# Patient Record
Sex: Female | Born: 1935 | Race: White | Hispanic: No | Marital: Married | Smoking: Never smoker
Health system: Southern US, Community
[De-identification: ages and names within clinical notes are randomized; demographics above are authoritative.]

## PROBLEM LIST (undated history)

## (undated) DIAGNOSIS — I639 Cerebral infarction, unspecified: Secondary | ICD-10-CM

## (undated) DIAGNOSIS — M1990 Unspecified osteoarthritis, unspecified site: Secondary | ICD-10-CM

## (undated) DIAGNOSIS — F32A Depression, unspecified: Secondary | ICD-10-CM

## (undated) DIAGNOSIS — I739 Peripheral vascular disease, unspecified: Secondary | ICD-10-CM

## (undated) DIAGNOSIS — I779 Disorder of arteries and arterioles, unspecified: Secondary | ICD-10-CM

## (undated) DIAGNOSIS — F329 Major depressive disorder, single episode, unspecified: Secondary | ICD-10-CM

## (undated) DIAGNOSIS — I10 Essential (primary) hypertension: Secondary | ICD-10-CM

## (undated) DIAGNOSIS — E785 Hyperlipidemia, unspecified: Secondary | ICD-10-CM

## (undated) HISTORY — DX: Depression, unspecified: F32.A

## (undated) HISTORY — DX: Peripheral vascular disease, unspecified: I73.9

## (undated) HISTORY — DX: Cerebral infarction, unspecified: I63.9

## (undated) HISTORY — DX: Unspecified osteoarthritis, unspecified site: M19.90

## (undated) HISTORY — DX: Disorder of arteries and arterioles, unspecified: I77.9

## (undated) HISTORY — PX: ABDOMINAL HYSTERECTOMY: SHX81

## (undated) HISTORY — DX: Hyperlipidemia, unspecified: E78.5

## (undated) HISTORY — DX: Major depressive disorder, single episode, unspecified: F32.9

## (undated) HISTORY — DX: Essential (primary) hypertension: I10

## (undated) HISTORY — PX: APPENDECTOMY: SHX54

---

## 1968-09-01 HISTORY — PX: PARTIAL HYSTERECTOMY: SHX80

## 1997-01-01 HISTORY — PX: ESOPHAGOGASTRODUODENOSCOPY: SHX1529

## 1998-01-01 LAB — HM DEXA SCAN: HM Dexa Scan: NEGATIVE

## 2000-01-02 HISTORY — PX: HEMORRHOID SURGERY: SHX153

## 2001-01-01 HISTORY — PX: CHOLECYSTECTOMY: SHX55

## 2005-08-07 ENCOUNTER — Ambulatory Visit: Payer: Self-pay | Admitting: Family Medicine

## 2005-08-29 ENCOUNTER — Ambulatory Visit: Payer: Self-pay | Admitting: Family Medicine

## 2005-09-06 ENCOUNTER — Ambulatory Visit: Payer: Self-pay | Admitting: Internal Medicine

## 2005-09-18 ENCOUNTER — Ambulatory Visit: Payer: Self-pay | Admitting: Family Medicine

## 2005-12-01 DIAGNOSIS — I10 Essential (primary) hypertension: Secondary | ICD-10-CM

## 2005-12-13 ENCOUNTER — Ambulatory Visit: Payer: Self-pay | Admitting: Family Medicine

## 2005-12-17 ENCOUNTER — Ambulatory Visit: Payer: Self-pay | Admitting: Family Medicine

## 2006-02-07 ENCOUNTER — Ambulatory Visit: Payer: Self-pay | Admitting: Family Medicine

## 2006-02-15 ENCOUNTER — Ambulatory Visit: Payer: Self-pay | Admitting: Licensed Clinical Social Worker

## 2006-02-25 ENCOUNTER — Ambulatory Visit: Payer: Self-pay | Admitting: Family Medicine

## 2006-04-01 ENCOUNTER — Encounter: Payer: Self-pay | Admitting: Family Medicine

## 2006-04-01 DIAGNOSIS — M1990 Unspecified osteoarthritis, unspecified site: Secondary | ICD-10-CM | POA: Insufficient documentation

## 2006-04-01 DIAGNOSIS — E7800 Pure hypercholesterolemia, unspecified: Secondary | ICD-10-CM

## 2006-04-01 DIAGNOSIS — F329 Major depressive disorder, single episode, unspecified: Secondary | ICD-10-CM

## 2006-04-01 DIAGNOSIS — J309 Allergic rhinitis, unspecified: Secondary | ICD-10-CM | POA: Insufficient documentation

## 2006-04-29 ENCOUNTER — Telehealth (INDEPENDENT_AMBULATORY_CARE_PROVIDER_SITE_OTHER): Payer: Self-pay | Admitting: *Deleted

## 2006-04-30 ENCOUNTER — Ambulatory Visit: Payer: Self-pay | Admitting: Family Medicine

## 2006-08-23 ENCOUNTER — Encounter: Payer: Self-pay | Admitting: Family Medicine

## 2006-10-19 ENCOUNTER — Encounter: Payer: Self-pay | Admitting: Family Medicine

## 2007-09-26 ENCOUNTER — Encounter (INDEPENDENT_AMBULATORY_CARE_PROVIDER_SITE_OTHER): Payer: Self-pay | Admitting: *Deleted

## 2008-07-09 ENCOUNTER — Ambulatory Visit: Payer: Self-pay | Admitting: Family Medicine

## 2008-07-09 LAB — CONVERTED CEMR LAB

## 2008-08-05 ENCOUNTER — Ambulatory Visit: Payer: Self-pay | Admitting: Family Medicine

## 2008-08-06 LAB — CONVERTED CEMR LAB
ALT: 24 units/L (ref 0–35)
AST: 28 units/L (ref 0–37)
Albumin: 3.9 g/dL (ref 3.5–5.2)
Alkaline Phosphatase: 67 units/L (ref 39–117)
BUN: 17 mg/dL (ref 6–23)
Basophils Absolute: 0.1 10*3/uL (ref 0.0–0.1)
Basophils Relative: 1 % (ref 0.0–3.0)
Bilirubin, Direct: 0.1 mg/dL (ref 0.0–0.3)
CO2: 29 meq/L (ref 19–32)
Calcium: 9.6 mg/dL (ref 8.4–10.5)
Chloride: 108 meq/L (ref 96–112)
Cholesterol: 326 mg/dL — ABNORMAL HIGH (ref 0–200)
Creatinine, Ser: 0.8 mg/dL (ref 0.4–1.2)
Direct LDL: 240.9 mg/dL
Eosinophils Absolute: 0.5 10*3/uL (ref 0.0–0.7)
Eosinophils Relative: 6.5 % — ABNORMAL HIGH (ref 0.0–5.0)
GFR calc non Af Amer: 73.78 mL/min (ref >60)
Glucose, Bld: 115 mg/dL — ABNORMAL HIGH (ref 70–99)
HCT: 44.2 % (ref 36.0–46.0)
HDL: 70.6 mg/dL (ref >39.00)
Hemoglobin: 15 g/dL (ref 12.0–15.0)
Lymphocytes Relative: 31.4 % (ref 12.0–46.0)
Lymphs Abs: 2.3 10*3/uL (ref 0.7–4.0)
MCHC: 33.9 g/dL (ref 30.0–36.0)
MCV: 90.1 fL (ref 78.0–100.0)
Monocytes Absolute: 0.5 10*3/uL (ref 0.1–1.0)
Monocytes Relative: 7.4 % (ref 3.0–12.0)
Neutro Abs: 4 10*3/uL (ref 1.4–7.7)
Neutrophils Relative %: 53.7 % (ref 43.0–77.0)
Platelets: 203 10*3/uL (ref 150.0–400.0)
Potassium: 4.7 meq/L (ref 3.5–5.1)
RBC: 4.91 M/uL (ref 3.87–5.11)
RDW: 12.3 % (ref 11.5–14.6)
Sodium: 146 meq/L — ABNORMAL HIGH (ref 135–145)
TSH: 2.94 microintl units/mL (ref 0.35–5.50)
Total Bilirubin: 0.9 mg/dL (ref 0.3–1.2)
Total CHOL/HDL Ratio: 5
Total Protein: 7 g/dL (ref 6.0–8.3)
Triglycerides: 122 mg/dL (ref 0.0–149.0)
VLDL: 24.4 mg/dL (ref 0.0–40.0)
WBC: 7.4 10*3/uL (ref 4.5–10.5)

## 2008-08-23 ENCOUNTER — Ambulatory Visit: Payer: Self-pay | Admitting: Family Medicine

## 2008-08-23 DIAGNOSIS — E119 Type 2 diabetes mellitus without complications: Secondary | ICD-10-CM

## 2008-08-23 LAB — CONVERTED CEMR LAB
Cholesterol, target level: 200 mg/dL
HDL goal, serum: 40 mg/dL
LDL Goal: 160 mg/dL

## 2008-08-31 ENCOUNTER — Ambulatory Visit: Payer: Self-pay | Admitting: Family Medicine

## 2008-09-02 LAB — CONVERTED CEMR LAB
BUN: 18 mg/dL (ref 6–23)
CO2: 32 meq/L (ref 19–32)
Calcium: 9.9 mg/dL (ref 8.4–10.5)
Chloride: 106 meq/L (ref 96–112)
Creatinine, Ser: 0.8 mg/dL (ref 0.4–1.2)
GFR calc non Af Amer: 73.77 mL/min (ref >60)
Glucose, Bld: 118 mg/dL — ABNORMAL HIGH (ref 70–99)
Potassium: 5.1 meq/L (ref 3.5–5.1)
Sodium: 144 meq/L (ref 135–145)

## 2008-09-14 ENCOUNTER — Encounter: Payer: Self-pay | Admitting: Family Medicine

## 2009-05-26 ENCOUNTER — Ambulatory Visit: Payer: Self-pay | Admitting: Family Medicine

## 2009-08-04 ENCOUNTER — Telehealth: Payer: Self-pay | Admitting: Family Medicine

## 2009-08-12 ENCOUNTER — Ambulatory Visit: Payer: Self-pay | Admitting: Family Medicine

## 2009-09-16 ENCOUNTER — Ambulatory Visit: Payer: Self-pay | Admitting: Family Medicine

## 2009-09-16 ENCOUNTER — Encounter: Payer: Self-pay | Admitting: Family Medicine

## 2009-09-19 ENCOUNTER — Ambulatory Visit: Payer: Self-pay | Admitting: Family Medicine

## 2009-09-22 LAB — CONVERTED CEMR LAB
ALT: 19 units/L (ref 0–35)
AST: 22 units/L (ref 0–37)
Albumin: 4 g/dL (ref 3.5–5.2)
Alkaline Phosphatase: 67 units/L (ref 39–117)
BUN: 19 mg/dL (ref 6–23)
Basophils Absolute: 0.1 10*3/uL (ref 0.0–0.1)
Basophils Relative: 0.9 % (ref 0.0–3.0)
Bilirubin, Direct: 0.1 mg/dL (ref 0.0–0.3)
CO2: 28 meq/L (ref 19–32)
Calcium: 9.3 mg/dL (ref 8.4–10.5)
Chloride: 101 meq/L (ref 96–112)
Cholesterol: 388 mg/dL — ABNORMAL HIGH (ref 0–200)
Creatinine, Ser: 0.8 mg/dL (ref 0.4–1.2)
Direct LDL: 280.9 mg/dL
Eosinophils Absolute: 0.5 10*3/uL (ref 0.0–0.7)
Eosinophils Relative: 6.8 % — ABNORMAL HIGH (ref 0.0–5.0)
GFR calc non Af Amer: 76.89 mL/min (ref >60)
Glucose, Bld: 135 mg/dL — ABNORMAL HIGH (ref 70–99)
HCT: 44.6 % (ref 36.0–46.0)
HDL: 72.8 mg/dL (ref >39.00)
Hemoglobin: 15.3 g/dL — ABNORMAL HIGH (ref 12.0–15.0)
Lymphocytes Relative: 38.2 % (ref 12.0–46.0)
Lymphs Abs: 2.6 10*3/uL (ref 0.7–4.0)
MCHC: 34.3 g/dL (ref 30.0–36.0)
MCV: 89.9 fL (ref 78.0–100.0)
Monocytes Absolute: 0.4 10*3/uL (ref 0.1–1.0)
Monocytes Relative: 5.7 % (ref 3.0–12.0)
Neutro Abs: 3.4 10*3/uL (ref 1.4–7.7)
Neutrophils Relative %: 48.4 % (ref 43.0–77.0)
Platelets: 227 10*3/uL (ref 150.0–400.0)
Potassium: 4.7 meq/L (ref 3.5–5.1)
RBC: 4.96 M/uL (ref 3.87–5.11)
RDW: 13.7 % (ref 11.5–14.6)
Sodium: 138 meq/L (ref 135–145)
TSH: 2.89 microintl units/mL (ref 0.35–5.50)
Total Bilirubin: 0.8 mg/dL (ref 0.3–1.2)
Total CHOL/HDL Ratio: 5
Total Protein: 6.7 g/dL (ref 6.0–8.3)
Triglycerides: 325 mg/dL — ABNORMAL HIGH (ref 0.0–149.0)
VLDL: 65 mg/dL — ABNORMAL HIGH (ref 0.0–40.0)
Vit D, 25-Hydroxy: 50 ng/mL (ref 30–89)
Vitamin B-12: 855 pg/mL (ref 211–911)
WBC: 6.9 10*3/uL (ref 4.5–10.5)

## 2009-12-27 ENCOUNTER — Ambulatory Visit: Payer: Self-pay | Admitting: Family Medicine

## 2009-12-27 DIAGNOSIS — J029 Acute pharyngitis, unspecified: Secondary | ICD-10-CM | POA: Insufficient documentation

## 2010-01-20 ENCOUNTER — Ambulatory Visit
Admission: RE | Admit: 2010-01-20 | Discharge: 2010-01-20 | Payer: Self-pay | Source: Home / Self Care | Attending: Family Medicine | Admitting: Family Medicine

## 2010-01-20 ENCOUNTER — Other Ambulatory Visit: Payer: Self-pay | Admitting: Family Medicine

## 2010-01-20 LAB — FECAL OCCULT BLOOD, GUAIAC: Fecal Occult Blood: NEGATIVE

## 2010-01-20 LAB — FECAL OCCULT BLOOD, IMMUNOCHEMICAL: Fecal Occult Bld: NEGATIVE

## 2010-01-23 ENCOUNTER — Encounter (INDEPENDENT_AMBULATORY_CARE_PROVIDER_SITE_OTHER): Payer: Self-pay | Admitting: *Deleted

## 2010-01-31 NOTE — Progress Notes (Signed)
Summary: Elevated BP at dentist office  Phone Note From Other Clinic Call back at [phone]   Caller: Ericka/Dentist office Call For: Dr. Ermalene Searing Summary of Call: Nurse from dentist office called to let us know that patient's blood pressure is very high.  Patient was in there office to have a tooth extracted today but they will not be doing that procedure because of her blood pressure.  They had her on the blood pressure monitor since 11:18 am this morning and her blood pressure were as follows: 214/114, 215/115, 228/119.  The nurse took her blood pressure manually and it was 240/110 right arm, and 230/107 left arm.  Patient is still there in the office and they are waiting to hear from [place].  Please advise.  Patient took herself off of her BP medications about a month ago.  Having not headaches or other symptoms.   Initial call taken by: Linde Gillis CMA Duncan Dull),  August 04, 2009 11:38 AM  Follow-up for Phone Call        Stopped lisinopril 2 months ago. Restart today. Follow up appt in 1 week for BP check.  Follow-up by: Kerby Nora MD,  August 04, 2009 11:42 AM
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
## 2010-02-02 NOTE — Letter (Signed)
Summary: Results Follow up Letter  [place] [facility]  [address] [place], [place] [address]   Phone: [phone]  Fax: [phone]    01/23/2010 MRN: [id number]     Carla Campos [address] [place], [place]  [address]    Dear Ms. Rapley,  The following are the results of your recent test(s):  Test         Result    Pap Smear:        Normal _____  Not Normal _____ Comments: ______________________________________________________ Cholesterol: LDL(Bad cholesterol):         Your goal is less than:         HDL (Good cholesterol):       Your goal is more than: Comments:  ______________________________________________________ Mammogram:        Normal _____  Not Normal _____ Comments:  ___________________________________________________________________ Hemoccult:        Normal __x___  Not normal _______ Comments:  Repeat in 1 year  _____________________________________________________________________ Other Tests:    We routinely do not discuss normal results over the telephone.  If you desire a copy of the results, or you have any questions about this information we can discuss them at your next office visit.   Sincerely,  Kerby Nora MD

## 2010-02-02 NOTE — Letter (Signed)
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

## 2010-02-27 ENCOUNTER — Encounter: Payer: Self-pay | Admitting: Family Medicine

## 2010-03-07 ENCOUNTER — Encounter: Payer: Self-pay | Admitting: Family Medicine

## 2010-03-31 ENCOUNTER — Other Ambulatory Visit: Payer: Self-pay | Admitting: Family Medicine

## 2010-03-31 DIAGNOSIS — E7800 Pure hypercholesterolemia, unspecified: Secondary | ICD-10-CM

## 2010-04-03 ENCOUNTER — Other Ambulatory Visit (INDEPENDENT_AMBULATORY_CARE_PROVIDER_SITE_OTHER): Payer: No Typology Code available for payment source | Admitting: Family Medicine

## 2010-04-03 DIAGNOSIS — E7800 Pure hypercholesterolemia, unspecified: Secondary | ICD-10-CM

## 2010-04-03 DIAGNOSIS — E119 Type 2 diabetes mellitus without complications: Secondary | ICD-10-CM

## 2010-04-03 LAB — LIPID PANEL
Cholesterol: 299 mg/dL — ABNORMAL HIGH (ref 0–200)
HDL: 75.6 mg/dL (ref >39.00)
Triglycerides: 253 mg/dL — ABNORMAL HIGH (ref 0.0–149.0)
VLDL: 50.6 mg/dL — ABNORMAL HIGH (ref 0.0–40.0)

## 2010-04-03 LAB — LDL CHOLESTEROL, DIRECT: Direct LDL: 186.1 mg/dL

## 2010-04-03 LAB — MICROALBUMIN / CREATININE URINE RATIO
Creatinine,U: 138.7 mg/dL
Microalb Creat Ratio: 3.6 mg/g (ref 0.0–30.0)

## 2010-04-10 ENCOUNTER — Ambulatory Visit: Payer: Self-pay | Admitting: Family Medicine

## 2010-04-28 ENCOUNTER — Ambulatory Visit (INDEPENDENT_AMBULATORY_CARE_PROVIDER_SITE_OTHER): Payer: No Typology Code available for payment source | Admitting: Family Medicine

## 2010-04-28 ENCOUNTER — Encounter: Payer: Self-pay | Admitting: Family Medicine

## 2010-04-28 DIAGNOSIS — E119 Type 2 diabetes mellitus without complications: Secondary | ICD-10-CM

## 2010-04-28 DIAGNOSIS — R0989 Other specified symptoms and signs involving the circulatory and respiratory systems: Secondary | ICD-10-CM | POA: Insufficient documentation

## 2010-04-28 DIAGNOSIS — E7800 Pure hypercholesterolemia, unspecified: Secondary | ICD-10-CM

## 2010-04-28 DIAGNOSIS — I10 Essential (primary) hypertension: Secondary | ICD-10-CM

## 2010-04-28 MED ORDER — LISINOPRIL 5 MG PO TABS: 5.0000 mg | ORAL_TABLET | Freq: Every day | ORAL | Status: DC

## 2010-04-28 NOTE — Progress Notes (Signed)
  Subjective:    Patient ID: Carla Campos, female    DOB: [birth year], 77 y.o.   MRN: [id number]  HPI  Diabetes: Well controlled on no medicaiton. Hypoglycemic episodes:None Hyperglycemic episodes:None Feet problems:None Blood Sugars averaging:NOT CHECKING eye exam within last year:  Hypertension:  Fairly well controlled for age. Using medication without problems or lightheadedness. Occ fluctuations 100/53 on lisinopril 10 mg daily.  Some fatigue She has been taking lisinopril every other night and BPs have been running better. Occ 170 sys. Chest pain with exertion: None Edema:None Short of breath:None Other issues:None  Elevated Cholesterol: Continued poor control but 100 points lower than last check. Has been taking red yeast rice. No SE.  Refused chol medication in past. Using fish oil now up to 1500 mg daily.        Review of Systems  Constitutional: Positive for fatigue. Negative for fever.       [Not severe fatigue HENT: Negative for ear pain.   Eyes: Negative for pain.  Respiratory: Negative for chest tightness and shortness of breath.   Cardiovascular: Negative for chest pain, palpitations and leg swelling.  Gastrointestinal: Negative for abdominal pain.  Genitourinary: Negative for dysuria.       Objective:   Physical Exam  Constitutional: Vital signs are normal. She appears well-developed and well-nourished. She is cooperative.  Non-toxic appearance. She does not appear ill. No distress.  HENT:  Head: Normocephalic.  Right Ear: Hearing, tympanic membrane, external ear and ear canal normal. Tympanic membrane is not erythematous, not retracted and not bulging.  Left Ear: Hearing, tympanic membrane, external ear and ear canal normal. Tympanic membrane is not erythematous, not retracted and not bulging.  Nose: No mucosal edema or rhinorrhea. Right sinus exhibits no maxillary sinus tenderness and no frontal sinus tenderness. Left sinus exhibits no maxillary  sinus tenderness and no frontal sinus tenderness.  Mouth/Throat: Uvula is midline, oropharynx is clear and moist and mucous membranes are normal.  Eyes: Conjunctivae, EOM and lids are normal. Pupils are equal, round, and reactive to light. No foreign bodies found.  Neck: Trachea normal and normal range of motion. Neck supple. Carotid bruit is present. No mass and no thyromegaly present.  Cardiovascular: Normal rate, regular rhythm, S1 normal, S2 normal, normal heart sounds, intact distal pulses and normal pulses.  Exam reveals no gallop and no friction rub.   No murmur heard. Pulmonary/Chest: Effort normal and breath sounds normal. Not tachypneic. No respiratory distress. She has no decreased breath sounds. She has no wheezes. She has no rhonchi. She has no rales.  Abdominal: Soft. Normal appearance and bowel sounds are normal. There is no tenderness.  Neurological: She is alert.  Skin: Skin is warm, dry and intact. No rash noted.  Psychiatric: Her speech is normal and behavior is normal. Judgment and thought content normal. Her mood appears not anxious. Cognition and memory are normal. She does not exhibit a depressed mood.       Diabetic foot exam: Normal inspection No skin breakdown No calluses  Normal DP pulses Normal sensation to light tough and monofilament Nails normal    Assessment & Plan:

## 2010-04-28 NOTE — Patient Instructions (Addendum)
BP today was 130/72. Increase red yeast rice up to 2400 mg daily. Stop by front desk to schedule carotid dopplers.

## 2010-04-28 NOTE — Assessment & Plan Note (Signed)
Well controlled on no medication  

## 2010-04-28 NOTE — Assessment & Plan Note (Signed)
Change prescription to 5 mg daily given low BPs... She will take daily instead of every other day. Follow BPs at home. Call if regularly >130/80.

## 2010-04-28 NOTE — Assessment & Plan Note (Signed)
New finding, but per pt she has had this noted years ago.. Was followed but then told she did not have to any longer. Send for carotid dopplers to determine status.

## 2010-04-28 NOTE — Assessment & Plan Note (Signed)
Improvfed control, but far from goal. Increase red yeast rice to 2400 mg daily.

## 2010-05-10 ENCOUNTER — Other Ambulatory Visit: Payer: Self-pay | Admitting: Family Medicine

## 2010-05-10 ENCOUNTER — Other Ambulatory Visit: Payer: Self-pay | Admitting: *Deleted

## 2010-05-10 ENCOUNTER — Ambulatory Visit (INDEPENDENT_AMBULATORY_CARE_PROVIDER_SITE_OTHER): Payer: No Typology Code available for payment source | Admitting: *Deleted

## 2010-05-10 DIAGNOSIS — R0989 Other specified symptoms and signs involving the circulatory and respiratory systems: Secondary | ICD-10-CM

## 2010-05-10 DIAGNOSIS — I6529 Occlusion and stenosis of unspecified carotid artery: Secondary | ICD-10-CM

## 2010-05-11 ENCOUNTER — Telehealth: Payer: Self-pay | Admitting: Family Medicine

## 2010-05-11 ENCOUNTER — Encounter: Payer: Self-pay | Admitting: Family Medicine

## 2010-05-11 DIAGNOSIS — I6523 Occlusion and stenosis of bilateral carotid arteries: Secondary | ICD-10-CM

## 2010-05-11 NOTE — Telephone Encounter (Signed)
Notify pt she has significant blockage in her carotid vessels. Close watch is indicated. Will repeat in 6 months.

## 2010-05-17 NOTE — Telephone Encounter (Signed)
Patient advised.
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
## 2010-06-09 NOTE — Telephone Encounter (Signed)
Patient is on recall list at [facility] for carotid doppler in November 2012. We put her on our defer list and will call her in November when she is due for followup. MK

## 2010-06-29 LAB — HM DIABETES EYE EXAM

## 2010-09-28 ENCOUNTER — Ambulatory Visit (INDEPENDENT_AMBULATORY_CARE_PROVIDER_SITE_OTHER): Payer: No Typology Code available for payment source | Admitting: Family Medicine

## 2010-09-28 ENCOUNTER — Encounter: Payer: Self-pay | Admitting: Family Medicine

## 2010-09-28 DIAGNOSIS — Z23 Encounter for immunization: Secondary | ICD-10-CM

## 2010-09-28 DIAGNOSIS — R319 Hematuria, unspecified: Secondary | ICD-10-CM | POA: Insufficient documentation

## 2010-09-28 DIAGNOSIS — N898 Other specified noninflammatory disorders of vagina: Secondary | ICD-10-CM

## 2010-09-28 DIAGNOSIS — K219 Gastro-esophageal reflux disease without esophagitis: Secondary | ICD-10-CM | POA: Insufficient documentation

## 2010-09-28 DIAGNOSIS — N939 Abnormal uterine and vaginal bleeding, unspecified: Secondary | ICD-10-CM

## 2010-09-28 LAB — POCT URINALYSIS DIPSTICK
Glucose, UA: NEGATIVE
Nitrite, UA: NEGATIVE
Protein, UA: NEGATIVE
Spec Grav, UA: 1.005
Urobilinogen, UA: NEGATIVE

## 2010-09-28 NOTE — Patient Instructions (Addendum)
Stop famotidine.  Start prilosec 2 x 20 mg tabs daily for 4-6 weeks, then if reflux resolved wean off. Avoid reflux triggering food. We will call you with urine culture results.

## 2010-09-28 NOTE — Assessment & Plan Note (Signed)
Poor control.. Start PPI prilosec 40 mg daily 4-6 weeks. Change diet and lifestyle. Wean off if improved, but call if not improved.

## 2010-09-28 NOTE — Progress Notes (Signed)
  Subjective:    Patient ID: Carla Campos, female    DOB: 22-May-1928, 77 y.o.   MRN: [id number]  HPI  77 year old postmenopausal female with new onset 1 month ago.. ? Blood on toilet tissue after BM. None since.  4 days ago noted bright red blood on toilet tissue after urination.  Since then notes blood on pad in her panties.  No dysuria, no pain with BM. Has noted some urinary frequency no urgency. No abdominal pain,no fever. Does note mild labial burning occ  mild low back ache.  Not on ASA.  Does note indigestion almost every time she eats... occ taking famotidine. Has not been taking prilosec daily.   Review of Systems  Constitutional: Negative for fever and fatigue.  HENT: Negative for ear pain.   Eyes: Negative for pain.  Respiratory: Negative for chest tightness and shortness of breath.   Cardiovascular: Positive for chest pain. Negative for palpitations and leg swelling.  Gastrointestinal: Negative for abdominal pain.  Genitourinary: Negative for dysuria.       Objective:   Physical Exam  Constitutional: Vital signs are normal. She appears well-developed and well-nourished. She is cooperative.  Non-toxic appearance. She does not appear ill. No distress.  HENT:  Head: Normocephalic.  Right Ear: Hearing, tympanic membrane, external ear and ear canal normal. Tympanic membrane is not erythematous, not retracted and not bulging.  Left Ear: Hearing, tympanic membrane, external ear and ear canal normal. Tympanic membrane is not erythematous, not retracted and not bulging.  Nose: No mucosal edema or rhinorrhea. Right sinus exhibits no maxillary sinus tenderness and no frontal sinus tenderness. Left sinus exhibits no maxillary sinus tenderness and no frontal sinus tenderness.  Mouth/Throat: Uvula is midline, oropharynx is clear and moist and mucous membranes are normal.  Eyes: Conjunctivae, EOM and lids are normal. Pupils are equal, round, and reactive to light. No foreign  bodies found.  Neck: Trachea normal and normal range of motion. Neck supple. Carotid bruit is not present. No mass and no thyromegaly present.  Cardiovascular: Normal rate, regular rhythm, S1 normal, S2 normal, normal heart sounds, intact distal pulses and normal pulses.  Exam reveals no gallop and no friction rub.   No murmur heard. Pulmonary/Chest: Effort normal and breath sounds normal. Not tachypneic. No respiratory distress. She has no decreased breath sounds. She has no wheezes. She has no rhonchi. She has no rales.  Abdominal: Soft. Normal appearance and bowel sounds are normal. There is no tenderness.  Genitourinary: Rectum normal. Rectal exam shows no external hemorrhoid, no internal hemorrhoid, no fissure, no mass, no tenderness and anal tone normal. Guaiac negative stool. There is no rash, tenderness, lesion or injury on the right labia. There is no tenderness, lesion or injury on the left labia. No erythema or tenderness around the vagina. No foreign body around the vagina. No signs of injury around the vagina. No vaginal discharge found.       Dry vaginal tissues, postmenopausal atrophy mild, o lesions No redness of irritation around urethral opening.  Neurological: She is alert.  Skin: Skin is warm, dry and intact. No rash noted.  Psychiatric: Her speech is normal and behavior is normal. Judgment and thought content normal. Her mood appears not anxious. Cognition and memory are normal. She does not exhibit a depressed mood.          Assessment & Plan:

## 2010-09-28 NOTE — Assessment & Plan Note (Signed)
No blood seen on rectal exam, neg hemeoccult.  No blood in vaginal vault, s/sp partial hysterectomy, no vaginal lesions.  Large blood on UA, occ rbc on micro.. send for culture, no pain associated with stones.  if culture negative .Marland Kitchen Consider urology eval for bladder cancer concern etc.

## 2010-09-30 LAB — URINE CULTURE: Colony Count: NO GROWTH

## 2010-10-02 ENCOUNTER — Telehealth: Payer: Self-pay | Admitting: Family Medicine

## 2010-10-02 NOTE — Telephone Encounter (Signed)
Message copied by Excell Seltzer on Mon Oct 02, 2010  4:45 PM ------      Message from: Consuello Masse      Created: Mon Oct 02, 2010  2:24 PM       Patient is still having bleeding

## 2010-10-02 NOTE — Telephone Encounter (Signed)
Given most clear source in blood in urine... Will refer to urology for further evaluation.

## 2010-10-02 NOTE — Telephone Encounter (Signed)
Patient advised.

## 2010-10-03 NOTE — Telephone Encounter (Signed)
Urology consult made with Dr Reuel Boom on 10/10/2010 patient notified. MK

## 2010-10-11 ENCOUNTER — Ambulatory Visit: Payer: No Typology Code available for payment source | Admitting: Specialist

## 2010-10-24 ENCOUNTER — Other Ambulatory Visit: Payer: Medicare Other

## 2010-10-31 ENCOUNTER — Ambulatory Visit: Payer: Medicare Other | Admitting: Family Medicine

## 2010-11-02 ENCOUNTER — Ambulatory Visit: Payer: Medicare Other | Admitting: Family Medicine

## 2010-12-04 ENCOUNTER — Ambulatory Visit (INDEPENDENT_AMBULATORY_CARE_PROVIDER_SITE_OTHER): Payer: Medicare Other | Admitting: Family Medicine

## 2010-12-04 ENCOUNTER — Encounter: Payer: Self-pay | Admitting: Family Medicine

## 2010-12-04 DIAGNOSIS — J040 Acute laryngitis: Secondary | ICD-10-CM

## 2010-12-04 MED ORDER — HYDROCODONE-HOMATROPINE 5-1.5 MG/5ML PO SYRP: ORAL_SOLUTION | ORAL | Status: AC

## 2010-12-04 NOTE — Progress Notes (Signed)
  Patient Name: Carla Campos Date of Birth: 06-20-1928 Age: 77 y.o. Medical Record Number: [id number] Gender: female  History of Present Illness:  Carla Campos is a 77 y.o. very pleasant female patient who presents with the following:  1 week of coughing, feeling terrible. Feels tired and weak all the time. Taking littl enaps. Coughing al night is really bad  Getting up in the middle of the night. Taking some cough syrup.  + laryngitis over the last week Mild st.  No n/v/d Eating ok   Past Medical History, Surgical History, Social History, Family History, and Problem List have been reviewed in EHR and updated if relevant.  Review of Systems: ROS: GEN: Acute illness details above GI: Tolerating PO intake GU: maintaining adequate hydration and urination Pulm: No SOB Interactive and getting along well at home.  Otherwise, ROS is as per the HPI.   Physical Examination: Filed Vitals:   12/04/10 1524  BP: 120/70  Pulse: 109  Temp: 97.7 F (36.5 C)  TempSrc: Oral  Height: 5\' 2"  (1.575 m)  Weight: 116 lb (52.617 kg)  SpO2: 99%    Body mass index is 21.22 kg/(m^2).   Gen: WDWN, NAD; A & O x3, cooperative. Pleasant.Globally Non-toxic HEENT: Normocephalic and atraumatic. Throat clear, w/o exudate, R TM clear, L TM - good landmarks, No fluid present. rhinnorhea.  MMM Frontal sinuses: NT Max sinuses: NT NECK: Anterior cervical  LAD is absent CV: RRR, No M/G/R, cap refill <2 sec PULM: Breathing comfortably in no respiratory distress. no wheezing, crackles, rhonchi EXT: No c/c/e PSYCH: Friendly, good eye contact MSK: Nml gait    Assessment and Plan:  URI/ laryngitis I discussed upper respiratory tract infections with the patient and explained viral infections in general.  Recommended plenty of sleep. Symptomatic care with pushing fluids. Oral acetaminophen or NSAIDs as tolerated for body aches, chills, fevers.  follow-up if acutely worsens

## 2010-12-06 ENCOUNTER — Telehealth: Payer: Self-pay | Admitting: Internal Medicine

## 2010-12-06 NOTE — Telephone Encounter (Signed)
Patient called and stated that she was seen by you on 12/05/10 and she can't afford the hydrocodone syrup because it was $55 dollars and wanted to know if there was something less expensive with Hydrocodone in it.  Please advise.

## 2010-12-06 NOTE — Telephone Encounter (Signed)
rx called in

## 2010-12-06 NOTE — Telephone Encounter (Signed)
Ok to change to generic robitussin with codeine, 1 tsp po qhs prn cough, disp 240 mL

## 2010-12-11 ENCOUNTER — Ambulatory Visit (INDEPENDENT_AMBULATORY_CARE_PROVIDER_SITE_OTHER): Payer: Medicare Other | Admitting: Family Medicine

## 2010-12-11 ENCOUNTER — Encounter: Payer: Self-pay | Admitting: Family Medicine

## 2010-12-11 VITALS — BP 148/84 | HR 84 | Temp 98.7°F | Wt 118.2 lb

## 2010-12-11 DIAGNOSIS — J019 Acute sinusitis, unspecified: Secondary | ICD-10-CM | POA: Insufficient documentation

## 2010-12-11 MED ORDER — DOXYCYCLINE HYCLATE 100 MG PO CAPS: 100.0000 mg | ORAL_CAPSULE | Freq: Two times a day (BID) | ORAL | Status: AC

## 2010-12-11 MED ORDER — GUAIFENESIN-CODEINE 100-10 MG/5ML PO SYRP: 5.0000 mL | ORAL_SOLUTION | Freq: Two times a day (BID) | ORAL | Status: AC | PRN

## 2010-12-11 NOTE — Assessment & Plan Note (Signed)
Going on 2 wks +. Cover with doxycycline. cheratussin for cough. Update [place] if sxs not improving as expected.

## 2010-12-11 NOTE — Progress Notes (Signed)
  Subjective:    Patient ID: Carla Campos, female    DOB: Mar 04, 1928, 77 y.o.   MRN: [id number]  HPI CC: cough, congestion  Seen last week by Dr. Salena Saner, told viral URTI.  Has been 2 + wks of sxs, still feeling ill.  + coughing congestion and staying up at night coughing.  Cough keeping her up at night.  Mainly head congestion.  + PNdrainage.    So far has tried some OTC cough syrup because hycodan was too expensive.  Also tried allergy med.  No fevers/chills, HA, ear pain or tooth pain, chest pain/tightness, SOB.  No sick contacts at home.  No smokers at home.  No h/o asthma, COPD.  Review of Systems Per HPI    Objective:   Physical Exam  Nursing note and vitals reviewed. Constitutional: She appears well-developed and well-nourished. No distress.  HENT:  Head: Normocephalic and atraumatic.  Right Ear: Hearing, tympanic membrane, external ear and ear canal normal.  Left Ear: Hearing, tympanic membrane, external ear and ear canal normal.  Nose: Rhinorrhea present. No mucosal edema. Right sinus exhibits maxillary sinus tenderness (mild). Right sinus exhibits no frontal sinus tenderness. Left sinus exhibits no maxillary sinus tenderness and no frontal sinus tenderness.  Mouth/Throat: Uvula is midline, oropharynx is clear and moist and mucous membranes are normal. No oropharyngeal exudate, posterior oropharyngeal edema, posterior oropharyngeal erythema or tonsillar abscesses.  Eyes: Conjunctivae and EOM are normal. Pupils are equal, round, and reactive to light. No scleral icterus.  Neck: Normal range of motion. Neck supple.  Cardiovascular: Normal rate, regular rhythm, normal heart sounds and intact distal pulses.   No murmur heard. Pulmonary/Chest: Effort normal and breath sounds normal. No respiratory distress. She has no wheezes. She has no rales.  Lymphadenopathy:    She has no cervical adenopathy.  Skin: Skin is warm and dry. No rash noted.       Assessment & Plan:

## 2010-12-11 NOTE — Patient Instructions (Signed)
You have a sinus infection. Take medicine as prescribed: doxycycline twice daily for 10 days cheratussin for cough at night. Push fluids and plenty of rest. Nasal saline irrigation or neti pot to help drain sinuses. May use simple mucinex with plenty of fluid to help mobilize mucous. Let us know if fever >101.5, trouble opening/closing mouth, difficulty swallowing, or worsening - you may need to be seen again.

## 2010-12-22 ENCOUNTER — Other Ambulatory Visit (INDEPENDENT_AMBULATORY_CARE_PROVIDER_SITE_OTHER): Payer: Medicare Other

## 2010-12-22 DIAGNOSIS — E7800 Pure hypercholesterolemia, unspecified: Secondary | ICD-10-CM

## 2010-12-22 DIAGNOSIS — E119 Type 2 diabetes mellitus without complications: Secondary | ICD-10-CM

## 2010-12-22 LAB — COMPREHENSIVE METABOLIC PANEL
AST: 26 U/L (ref 0–37)
Albumin: 3.9 g/dL (ref 3.5–5.2)
Alkaline Phosphatase: 62 U/L (ref 39–117)
Calcium: 9.6 mg/dL (ref 8.4–10.5)
Chloride: 107 mEq/L (ref 96–112)
Glucose, Bld: 107 mg/dL — ABNORMAL HIGH (ref 70–99)
Potassium: 4.9 mEq/L (ref 3.5–5.1)
Sodium: 144 mEq/L (ref 135–145)
Total Protein: 7 g/dL (ref 6.0–8.3)

## 2010-12-22 LAB — LIPID PANEL: HDL: 79.2 mg/dL (ref >39.00)

## 2010-12-22 LAB — LDL CHOLESTEROL, DIRECT: Direct LDL: 181.8 mg/dL

## 2010-12-22 LAB — HEMOGLOBIN A1C: Hgb A1c MFr Bld: 6.1 % (ref 4.6–6.5)

## 2010-12-29 ENCOUNTER — Encounter: Payer: Self-pay | Admitting: Family Medicine

## 2010-12-29 ENCOUNTER — Ambulatory Visit (INDEPENDENT_AMBULATORY_CARE_PROVIDER_SITE_OTHER): Payer: Medicare Other | Admitting: Family Medicine

## 2010-12-29 DIAGNOSIS — I10 Essential (primary) hypertension: Secondary | ICD-10-CM

## 2010-12-29 DIAGNOSIS — E7800 Pure hypercholesterolemia, unspecified: Secondary | ICD-10-CM

## 2010-12-29 DIAGNOSIS — E119 Type 2 diabetes mellitus without complications: Secondary | ICD-10-CM

## 2010-12-29 DIAGNOSIS — I6523 Occlusion and stenosis of bilateral carotid arteries: Secondary | ICD-10-CM

## 2010-12-29 DIAGNOSIS — S139XXA Sprain of joints and ligaments of unspecified parts of neck, initial encounter: Secondary | ICD-10-CM

## 2010-12-29 DIAGNOSIS — Z0000 Encounter for general adult medical examination without abnormal findings: Secondary | ICD-10-CM

## 2010-12-29 DIAGNOSIS — S161XXA Strain of muscle, fascia and tendon at neck level, initial encounter: Secondary | ICD-10-CM

## 2010-12-29 LAB — HM DIABETES FOOT EXAM

## 2010-12-29 NOTE — Assessment & Plan Note (Signed)
Well controlled. Continue current medication.  

## 2010-12-29 NOTE — Patient Instructions (Addendum)
Restart red yeast rice 2400 mg daily. Follow up in 6 months with labs prior. Consider setting up living will. Heat and gentle neck stretching for neck pain. Follow up if not improving.

## 2010-12-29 NOTE — Assessment & Plan Note (Signed)
.  Well controlleld with diet. 

## 2010-12-29 NOTE — Assessment & Plan Note (Signed)
Poor control.. Goal LDL <70 with carotid stenosis. Pt understands risks but does not want statin.. Wants to retry red yeast rice which has helped some in past. Recheck in 6 months.

## 2010-12-29 NOTE — Progress Notes (Signed)
Subjective:    Patient ID: Carla Campos, female    DOB: Apr 25, 1928, 77 y.o.   MRN: [id number]  HPI  I have personally reviewed the Medicare Annual Wellness questionnaire and have noted 1. The patient's medical and social history 2. Their use of alcohol, tobacco or illicit drugs 3. Their current medications and supplements 4. The patient's functional ability including ADL's, fall risks, home safety risks and hearing or visual             impairment. 5. Diet and physical activities 6. Evidence for depression or mood disorders  The patients weight, height, BMI and visual acuity have been recorded in the chart I have made referrals, counseling and provided education to the patient based review of the above and I have provided the pt with a written personalized care plan for preventive services.  Diabetes: Well controlled on no med. Lab Results  Component Value Date   HGBA1C 6.1 12/22/2010  Hypoglycemic episodes:? Hyperglycemic episodes:? Feet problems:None Blood Sugars averaging:not checking eye exam within last year:yes  Hypertension:  Well controlled on lisinopril 5 mg daily.  Using medication without problems or lightheadedness:  Chest pain with exertion:None Edema:None Short of breath:None Average home BPs: at goal Other issues:  Elevated Cholesterol:Inadequate control of LDL and tri. Pt on fish oil.  Was on red yeast rice, but stopped due to stomach aches.. Not sure if this  Caused, willing to restart. Diet compliance:Good. Exercise:limited, but walking 3 times a week. Other complaints: REFUSES STATINS.  Carotid stenosis: re-eval with dopplers in 05/2010: moderate/severe B follow up in 6 months recommended.  She refused to continue to follow this.. She wpould not do CEA surgery even if indicated. She understands the risks and benefits and still refuses to follow or treat cholesterol aggressively.  Pain in left neck, pain with turing neck to left or back. Sleeps on  left side, occ wakes her up at night, relieved if lays on back.  No pain with moving your arm.   No fever, no numbness, no weakness, no unexpected weight loss, no fatigue, occ rare night sweats.   Review of Systems  Constitutional: Negative for fever, fatigue and unexpected weight change.  HENT: Negative for ear pain, congestion, sore throat, sneezing, trouble swallowing and sinus pressure.   Eyes: Negative for pain and itching.  Respiratory: Negative for cough, shortness of breath and wheezing.   Cardiovascular: Negative for chest pain, palpitations and leg swelling.  Gastrointestinal: Negative for nausea, abdominal pain, diarrhea, constipation and blood in stool.  Genitourinary: Negative for dysuria, hematuria, vaginal discharge, difficulty urinating and menstrual problem.  Skin: Negative for rash.  Neurological: Negative for syncope, weakness, light-headedness, numbness and headaches.  Psychiatric/Behavioral: Negative for confusion and dysphoric mood. The patient is not nervous/anxious.        Objective:   Physical Exam  Constitutional: She is oriented to person, place, and time. Vital signs are normal. She appears well-developed and well-nourished. She is cooperative.  Non-toxic appearance. She does not appear ill. No distress.  HENT:  Head: Normocephalic.  Right Ear: Hearing, tympanic membrane, external ear and ear canal normal.  Left Ear: Hearing, tympanic membrane, external ear and ear canal normal.  Nose: Nose normal.  Eyes: Conjunctivae, EOM and lids are normal. Pupils are equal, round, and reactive to light. No foreign bodies found.  Neck: Trachea normal. Carotid bruit is not present. No mass and no thyromegaly present.       Decreased ROM of neck laterally Neg spurlings No vertebral ttp  ttp over trapezius  Cardiovascular: Normal rate, regular rhythm, S1 normal, S2 normal, normal heart sounds and intact distal pulses.  Exam reveals no gallop.   No murmur  heard. Pulmonary/Chest: Effort normal and breath sounds normal. No respiratory distress. She has no wheezes. She has no rhonchi. She has no rales.  Abdominal: Soft. Normal appearance and bowel sounds are normal. She exhibits no distension, no fluid wave, no abdominal bruit and no mass. There is no hepatosplenomegaly. There is no tenderness. There is no rebound, no guarding and no CVA tenderness. No hernia.  Lymphadenopathy:    She has no cervical adenopathy.    She has no axillary adenopathy.  Neurological: She is alert and oriented to person, place, and time. She has normal strength. No cranial nerve deficit or sensory deficit. She exhibits normal muscle tone.  Skin: Skin is warm, dry and intact. No rash noted.  Psychiatric: Her speech is normal and behavior is normal. Judgment normal. Her mood appears not anxious. Cognition and memory are normal. She does not exhibit a depressed mood.     Diabetic foot exam: Normal inspection No skin breakdown No calluses  Normal DP pulses Normal sensation to light touch and monofilament Nails normal      Assessment & Plan:  AMW: The patient's preventative maintenance and recommended screening tests for an annual wellness exam were reviewed in full today. Brought up to date unless services declined.  Counselled on the importance of diet, exercise, and its role in overall health and mortality. The patient's FH and SH was reviewed, including their home life, tobacco status, and drug and alcohol status.   Vaccines:Uptodate with flu, PNA, Td,will get shingles vaccine at [facility]. EAV:[id number] this year, may consider. Mammogram:Not indicated. DVE/pap: not indicated Colon: stool cards neg last year, no colonoscopy eval indicated.

## 2010-12-29 NOTE — Assessment & Plan Note (Signed)
Likely diue to MSK strain, also some evidence of vertebral arthritis. Treat with heat and stretches. NSAIDS OTC prn. Follow up if not improving.

## 2011-01-15 ENCOUNTER — Other Ambulatory Visit: Payer: Self-pay | Admitting: Cardiology

## 2011-01-15 DIAGNOSIS — I6529 Occlusion and stenosis of unspecified carotid artery: Secondary | ICD-10-CM

## 2011-05-17 ENCOUNTER — Other Ambulatory Visit: Payer: Self-pay | Admitting: Family Medicine

## 2011-06-05 ENCOUNTER — Telehealth: Payer: Self-pay | Admitting: Family Medicine

## 2011-06-05 DIAGNOSIS — E119 Type 2 diabetes mellitus without complications: Secondary | ICD-10-CM

## 2011-06-05 DIAGNOSIS — I10 Essential (primary) hypertension: Secondary | ICD-10-CM

## 2011-06-05 DIAGNOSIS — E7800 Pure hypercholesterolemia, unspecified: Secondary | ICD-10-CM

## 2011-06-05 NOTE — Telephone Encounter (Signed)
Message copied by Excell Seltzer on Tue Jun 05, 2011 11:13 AM ------      Message from: Baldomero Lamy      Created: Tue Jun 05, 2011  8:26 AM      Regarding: 6 mo f/u labs Tues 6/11       Please order  future 6 mo f/u  labs for pt's upcomming lab appt.      Thanks      Rodney Booze

## 2011-06-12 ENCOUNTER — Other Ambulatory Visit (INDEPENDENT_AMBULATORY_CARE_PROVIDER_SITE_OTHER): Payer: Medicare Other

## 2011-06-12 DIAGNOSIS — E7800 Pure hypercholesterolemia, unspecified: Secondary | ICD-10-CM

## 2011-06-12 DIAGNOSIS — E119 Type 2 diabetes mellitus without complications: Secondary | ICD-10-CM

## 2011-06-12 DIAGNOSIS — I10 Essential (primary) hypertension: Secondary | ICD-10-CM

## 2011-06-12 LAB — LIPID PANEL
Cholesterol: 283 mg/dL — ABNORMAL HIGH (ref 0–200)
HDL: 84.1 mg/dL (ref >39.00)
Total CHOL/HDL Ratio: 3
Triglycerides: 115 mg/dL (ref 0.0–149.0)

## 2011-06-12 LAB — COMPREHENSIVE METABOLIC PANEL
ALT: 21 U/L (ref 0–35)
AST: 28 U/L (ref 0–37)
Alkaline Phosphatase: 60 U/L (ref 39–117)
CO2: 30 mEq/L (ref 19–32)
Creatinine, Ser: 0.8 mg/dL (ref 0.4–1.2)
GFR: 71.19 mL/min (ref >60.00)
Sodium: 146 mEq/L — ABNORMAL HIGH (ref 135–145)
Total Bilirubin: 0.4 mg/dL (ref 0.3–1.2)
Total Protein: 6.6 g/dL (ref 6.0–8.3)

## 2011-06-12 LAB — LDL CHOLESTEROL, DIRECT: Direct LDL: 182.1 mg/dL

## 2011-06-19 ENCOUNTER — Encounter: Payer: Self-pay | Admitting: Family Medicine

## 2011-06-19 ENCOUNTER — Ambulatory Visit (INDEPENDENT_AMBULATORY_CARE_PROVIDER_SITE_OTHER): Payer: Medicare Other | Admitting: Family Medicine

## 2011-06-19 VITALS — BP 144/70 | HR 88 | Temp 98.2°F | Wt 119.0 lb

## 2011-06-19 DIAGNOSIS — I10 Essential (primary) hypertension: Secondary | ICD-10-CM

## 2011-06-19 DIAGNOSIS — E119 Type 2 diabetes mellitus without complications: Secondary | ICD-10-CM

## 2011-06-19 DIAGNOSIS — E7800 Pure hypercholesterolemia, unspecified: Secondary | ICD-10-CM

## 2011-06-19 NOTE — Progress Notes (Signed)
Subjective:    Patient ID: Carla Campos, female    DOB: 10/01/1928, 78 y.o.   MRN: [id number]  HPI Diabetes: Well controlled on no med.  Lab Results  Component Value Date   HGBA1C 6.1 06/12/2011  Hypoglycemic episodes:?  Hyperglycemic episodes:?  Feet problems:None, no ulcers Blood Sugars averaging: not checking  eye exam within last year: yes   Hypertension: Borederline  control on lisinopril 5 mg daily.  Using medication without problems or lightheadedness:  Chest pain with exertion:None  Edema:None  Short of breath:None  Average home BPs: at goal  At home Other issues:   Elevated Cholesterol: Inadequate control of LDL. LDL is the same, but tri better and HDL better. Lab Results  Component Value Date   CHOL 283* 06/12/2011   HDL 84.10 06/12/2011   LDLDIRECT 182.1 06/12/2011   TRIG 115.0 06/12/2011   CHOLHDL 3 06/12/2011   Pt on fish oil.  Restarted red yeast rice Diet compliance:Good.  Exercise:limited, but walking 3 times a week.  Other complaints: REFUSES STATINS.   Carotid stenosis: re-eval with dopplers in 05/2010: moderate/severe B follow up in 6 months recommended.  She refused to continue to follow this.. She wpould not do CEA surgery even if indicated. She understands the risks and benefits and still refuses to follow or treat cholesterol aggressively.      Objective:         Review of Systems  Constitutional: Negative for fever, fatigue and unexpected weight change.  HENT: Negative for ear pain, congestion, sore throat, sneezing, trouble swallowing and sinus pressure.   Eyes: Negative for pain and itching.  Respiratory: Negative for cough, shortness of breath and wheezing.   Cardiovascular: Negative for chest pain, palpitations and leg swelling.  Gastrointestinal: Negative for nausea, abdominal pain, diarrhea, constipation and blood in stool.  Genitourinary: Negative for dysuria, hematuria, vaginal discharge, difficulty urinating and menstrual problem.    Skin: Negative for rash.  Neurological: Negative for syncope, weakness, light-headedness, numbness and headaches.  Psychiatric/Behavioral: Negative for confusion and dysphoric mood. The patient is not nervous/anxious.        Objective:   Physical Exam  Constitutional: Vital signs are normal. She appears well-developed and well-nourished. She is cooperative.  Non-toxic appearance. She does not appear ill. No distress.  HENT:  Head: Normocephalic.  Right Ear: Hearing, tympanic membrane, external ear and ear canal normal. Tympanic membrane is not erythematous, not retracted and not bulging.  Left Ear: Hearing, tympanic membrane, external ear and ear canal normal. Tympanic membrane is not erythematous, not retracted and not bulging.  Nose: No mucosal edema or rhinorrhea. Right sinus exhibits no maxillary sinus tenderness and no frontal sinus tenderness. Left sinus exhibits no maxillary sinus tenderness and no frontal sinus tenderness.  Mouth/Throat: Uvula is midline, oropharynx is clear and moist and mucous membranes are normal.  Eyes: Conjunctivae, EOM and lids are normal. Pupils are equal, round, and reactive to light. No foreign bodies found.  Neck: Trachea normal and normal range of motion. Neck supple. Carotid bruit is not present. No mass and no thyromegaly present.  Cardiovascular: Normal rate, regular rhythm, S1 normal, S2 normal, normal heart sounds, intact distal pulses and normal pulses.  Exam reveals no gallop and no friction rub.   No murmur heard. Pulmonary/Chest: Effort normal and breath sounds normal. Not tachypneic. No respiratory distress. She has no decreased breath sounds. She has no wheezes. She has no rhonchi. She has no rales.  Abdominal: Soft. Normal appearance and bowel sounds are normal.  There is no tenderness.  Neurological: She is alert.  Skin: Skin is warm, dry and intact. No rash noted.  Psychiatric: Her speech is normal and behavior is normal. Judgment and thought  content normal. Her mood appears not anxious. Cognition and memory are normal. She does not exhibit a depressed mood.      Diabetic foot exam:  Normal inspection  No skin breakdown  No calluses  Normal DP pulses  Normal sensation to light touch and monofilament  Nails normal      Assessment & Plan:

## 2011-06-19 NOTE — Assessment & Plan Note (Signed)
Well controlled. Continue current medication.  

## 2011-06-19 NOTE — Assessment & Plan Note (Signed)
Well controlled on no medication  

## 2011-06-19 NOTE — Patient Instructions (Addendum)
Follow BP at home closely here for a while. Your goal BP <130/80. Call if it is higher than this. Try to increase red yeast rice to 3 to 4 tablets daily. Work on regular exercise.

## 2011-06-19 NOTE — Assessment & Plan Note (Signed)
LDL is the same, but tri better and HDL better. Try to increase red yeast rice to 4 tabs daily ... 2400 mg daily. If not effective at 6 month check consider adding welchol.

## 2011-12-24 ENCOUNTER — Other Ambulatory Visit (INDEPENDENT_AMBULATORY_CARE_PROVIDER_SITE_OTHER): Payer: Medicare Other

## 2011-12-24 ENCOUNTER — Telehealth: Payer: Self-pay | Admitting: Family Medicine

## 2011-12-24 DIAGNOSIS — E119 Type 2 diabetes mellitus without complications: Secondary | ICD-10-CM

## 2011-12-24 DIAGNOSIS — E7800 Pure hypercholesterolemia, unspecified: Secondary | ICD-10-CM

## 2011-12-24 LAB — COMPREHENSIVE METABOLIC PANEL
AST: 22 U/L (ref 0–37)
BUN: 20 mg/dL (ref 6–23)
Calcium: 9.8 mg/dL (ref 8.4–10.5)
Chloride: 104 mEq/L (ref 96–112)
Creatinine, Ser: 0.8 mg/dL (ref 0.4–1.2)
Glucose, Bld: 128 mg/dL — ABNORMAL HIGH (ref 70–99)

## 2011-12-24 LAB — LIPID PANEL
Cholesterol: 322 mg/dL — ABNORMAL HIGH (ref 0–200)
HDL: 96.1 mg/dL (ref >39.00)
Triglycerides: 202 mg/dL — ABNORMAL HIGH (ref 0.0–149.0)

## 2011-12-24 LAB — HEMOGLOBIN A1C: Hgb A1c MFr Bld: 6.4 % (ref 4.6–6.5)

## 2011-12-24 NOTE — Telephone Encounter (Signed)
Message copied by Excell Seltzer on Mon Dec 24, 2011  8:20 AM ------      Message from: Baldomero Lamy      Created: Tue Dec 18, 2011 12:43 PM      Regarding: Cpx labs Mon 12/23       Please order  future cpx labs for pt's upcoming lab appt.      Thanks      Rodney Booze

## 2012-01-01 ENCOUNTER — Ambulatory Visit (INDEPENDENT_AMBULATORY_CARE_PROVIDER_SITE_OTHER): Payer: Medicare Other | Admitting: Family Medicine

## 2012-01-01 ENCOUNTER — Encounter: Payer: Self-pay | Admitting: Family Medicine

## 2012-01-01 VITALS — BP 138/72 | HR 96 | Temp 97.0°F | Ht 61.75 in | Wt 117.5 lb

## 2012-01-01 DIAGNOSIS — E119 Type 2 diabetes mellitus without complications: Secondary | ICD-10-CM

## 2012-01-01 DIAGNOSIS — Z0000 Encounter for general adult medical examination without abnormal findings: Secondary | ICD-10-CM

## 2012-01-01 DIAGNOSIS — E7800 Pure hypercholesterolemia, unspecified: Secondary | ICD-10-CM

## 2012-01-01 DIAGNOSIS — I10 Essential (primary) hypertension: Secondary | ICD-10-CM

## 2012-01-01 DIAGNOSIS — E559 Vitamin D deficiency, unspecified: Secondary | ICD-10-CM

## 2012-01-01 LAB — HM DIABETES FOOT EXAM

## 2012-01-01 NOTE — Patient Instructions (Addendum)
Consider restarting red yeast rice. Work on [employer] and regular exercise. Follow up up in 6 months with fasting labs prior.

## 2012-01-01 NOTE — Progress Notes (Signed)
I have personally reviewed the Medicare Annual Wellness questionnaire and have noted  1. The patient's medical and social history  2. Their use of alcohol, tobacco or illicit drugs  3. Their current medications and supplements  4. The patient's functional ability including ADL's, fall risks, home safety risks and hearing or visual  impairment.  5. Diet and physical activities  6. Evidence for depression or mood disorders  The patients weight, height, BMI and visual acuity have been recorded in the chart  I have made referrals, counseling and provided education to the patient based review of the above and I have provided the pt with a written personalized care plan for preventive services.   Doing well overall , no complaints at this time. No recent falls.  Seeing Dr. Valentina Gu about hip pain. Improved with steroid injection and exercises.  Diabetes: Well controlled on no med.  Has been eating more sweets over the holiday compared to usual. Lab Results  Component Value Date   HGBA1C 6.4 12/24/2011  Hypoglycemic episodes:?  Hyperglycemic episodes:?  Feet problems:None  Blood Sugars averaging:not checking  eye exam within last year:yes   Hypertension: Well controlled on lisinopril 5 mg daily.  Using medication without problems or lightheadedness:  Chest pain with exertion:None  Edema:None  Short of breath:None  Average home BPs: at goal 130/80, rarely 150/72 on a day she was feeling ill. Other issues:   Elevated Cholesterol:Inadequate control of LDL and tri. Pt on fish oil.  Worsened control. Was on red yeast rice, but stopped due to ? Elevating Bp some... Not sure if this Caused, willing to restart.  Diet compliance:Good.  Exercise: walking 3 times a week.   Other complaints: REFUSES STATINS.   Carotid stenosis: re-eval with dopplers in 05/2010: moderate/severe B follow up in 6 months recommended.  She refused to continue to follow this.. She would not do CEA surgery even if  indicated. She understands the risks and benefits and still refuses to follow or treat cholesterol aggressively.  Review of Systems  Constitutional: Negative for fever, fatigue and unexpected weight change.  HENT: Negative for ear pain, congestion, sore throat, sneezing, trouble swallowing and sinus pressure.  Eyes: Negative for pain and itching.  Respiratory: Negative for cough, shortness of breath and wheezing.  Cardiovascular: Negative for chest pain, palpitations and leg swelling.  Gastrointestinal: Negative for nausea, abdominal pain, diarrhea, constipation and blood in stool.  Genitourinary: Negative for dysuria, hematuria, vaginal discharge, difficulty urinating and menstrual problem.  Skin: Negative for rash.  Neurological: Negative for syncope, weakness, light-headedness, numbness and headaches.  Psychiatric/Behavioral: Negative for confusion and dysphoric mood. The patient is not nervous/anxious.    Objective:   Physical Exam  Constitutional: She is oriented to person, place, and time. Vital signs are normal. She appears well-developed and well-nourished. She is cooperative. Non-toxic appearance. She does not appear ill. No distress.  HENT:  Head: Normocephalic.  Right Ear: Hearing, tympanic membrane, external ear and ear canal normal.  Left Ear: Hearing, tympanic membrane, external ear and ear canal normal.  Nose: Nose normal.  Eyes: Conjunctivae, EOM and lids are normal. Pupils are equal, round, and reactive to light. No foreign bodies found.  Neck: Trachea normal. Carotid bruit IS present BILATERALLY greater on left than reight. No mass and no thyromegaly present.  Cardiovascular: Normal rate, regular rhythm, S1 normal, S2 normal, normal heart sounds and intact distal pulses. Exam reveals no gallop.  No murmur heard.  Pulmonary/Chest: Effort normal and breath sounds normal. No respiratory  distress. She has no wheezes. She has no rhonchi. She has no rales.  Abdominal: Soft.  Normal appearance and bowel sounds are normal. She exhibits no distension, no fluid wave, no abdominal bruit and no mass. There is no hepatosplenomegaly. There is no tenderness. There is no rebound, no guarding and no CVA tenderness. No hernia.  Lymphadenopathy:  She has no cervical adenopathy.  She has no axillary adenopathy.  Neurological: She is alert and oriented to person, place, and time. She has normal strength. No cranial nerve deficit or sensory deficit. She exhibits normal muscle tone.  Skin: Skin is warm, dry and intact. No rash noted.  Psychiatric: Her speech is normal and behavior is normal. Judgment normal. Her mood appears not anxious. Cognition and memory are normal. She does not exhibit a depressed mood.   Diabetic foot exam:  Normal inspection  No skin breakdown  No calluses  Normal DP pulses  Normal sensation to light touch and monofilament  Nails thickened and slightly yellow  Assessment & Plan:   AMW: The patient's preventative maintenance and recommended screening tests for an annual wellness exam were reviewed in full today.  Brought up to date unless services declined.  Counselled on the importance of diet, exercise, and its role in overall health and mortality.  The patient's FH and SH was reviewed, including their home life, tobacco status, and drug and alcohol status.   Vaccines:Uptodate with flu, PNA, Td,got shingles vaccine at [facility].  VHQ:IONGEXB to continue evaluating this, she is taking calcium and getting weight bearing exercsie.  Mammogram:Not indicated.  DVE/pap: not indicated  Colon: stool cards neg 2011, no colonoscopy eval indicated.  HYPERCHOLESTEROLEMIA - Poor control.. Goal LDL <70 with carotid stenosis.  Pt understands risks but does not want statin.Marland Kitchen MAy consider restarting red yeast rice. HYPERTENSION -  Well controlled. Continue current medication.  DM - Well controlleld with diet.

## 2012-02-06 ENCOUNTER — Encounter: Payer: Self-pay | Admitting: Family Medicine

## 2012-02-06 ENCOUNTER — Ambulatory Visit (INDEPENDENT_AMBULATORY_CARE_PROVIDER_SITE_OTHER): Payer: Medicare Other | Admitting: Family Medicine

## 2012-02-06 VITALS — BP 140/84 | HR 98 | Temp 98.5°F | Ht 61.75 in | Wt 118.2 lb

## 2012-02-06 DIAGNOSIS — J209 Acute bronchitis, unspecified: Secondary | ICD-10-CM

## 2012-02-06 MED ORDER — DOXYCYCLINE HYCLATE 100 MG PO TABS: 100.0000 mg | ORAL_TABLET | Freq: Two times a day (BID) | ORAL | Status: DC

## 2012-02-06 MED ORDER — HYDROCODONE-HOMATROPINE 5-1.5 MG/5ML PO SYRP: ORAL_SOLUTION | ORAL | Status: DC
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
## 2012-04-03 ENCOUNTER — Other Ambulatory Visit: Payer: Self-pay | Admitting: Family Medicine

## 2012-05-05 ENCOUNTER — Ambulatory Visit (INDEPENDENT_AMBULATORY_CARE_PROVIDER_SITE_OTHER): Payer: Medicare Other | Admitting: Internal Medicine

## 2012-05-05 ENCOUNTER — Encounter: Payer: Self-pay | Admitting: Internal Medicine

## 2012-05-05 VITALS — BP 140/78 | HR 97 | Temp 97.5°F | Wt 118.0 lb

## 2012-05-05 DIAGNOSIS — M79644 Pain in right finger(s): Secondary | ICD-10-CM | POA: Insufficient documentation

## 2012-05-05 DIAGNOSIS — M79609 Pain in unspecified limb: Secondary | ICD-10-CM
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
## 2012-05-05 NOTE — Patient Instructions (Signed)
Please let me know if your thumb area is not better---- I would set you up with a dermatologist. (I recommend Drs. Dasher and Isenstein in [place])

## 2012-06-02 LAB — HM DIABETES EYE EXAM

## 2012-06-27 ENCOUNTER — Other Ambulatory Visit (INDEPENDENT_AMBULATORY_CARE_PROVIDER_SITE_OTHER): Payer: Medicare Other

## 2012-06-27 DIAGNOSIS — E119 Type 2 diabetes mellitus without complications: Secondary | ICD-10-CM

## 2012-06-27 DIAGNOSIS — E559 Vitamin D deficiency, unspecified: Secondary | ICD-10-CM

## 2012-06-27 DIAGNOSIS — E7800 Pure hypercholesterolemia, unspecified: Secondary | ICD-10-CM

## 2012-06-27 LAB — COMPREHENSIVE METABOLIC PANEL
ALT: 20 U/L (ref 0–35)
Albumin: 4 g/dL (ref 3.5–5.2)
Alkaline Phosphatase: 63 U/L (ref 39–117)
CO2: 25 mEq/L (ref 19–32)
Glucose, Bld: 135 mg/dL — ABNORMAL HIGH (ref 70–99)
Potassium: 4 mEq/L (ref 3.5–5.1)
Sodium: 140 mEq/L (ref 135–145)
Total Protein: 7.1 g/dL (ref 6.0–8.3)

## 2012-06-27 LAB — LDL CHOLESTEROL, DIRECT: Direct LDL: 239.4 mg/dL

## 2012-06-27 LAB — LIPID PANEL: HDL: 77.9 mg/dL (ref >39.00)

## 2012-06-27 LAB — HEMOGLOBIN A1C: Hgb A1c MFr Bld: 6.3 % (ref 4.6–6.5)

## 2012-06-28 LAB — VITAMIN D 25 HYDROXY (VIT D DEFICIENCY, FRACTURES): Vit D, 25-Hydroxy: 65 ng/mL (ref 30–89)

## 2012-07-01 ENCOUNTER — Ambulatory Visit (INDEPENDENT_AMBULATORY_CARE_PROVIDER_SITE_OTHER): Payer: Medicare Other | Admitting: Family Medicine

## 2012-07-01 ENCOUNTER — Encounter: Payer: Self-pay | Admitting: Family Medicine

## 2012-07-01 VITALS — BP 130/82 | HR 90 | Temp 98.1°F | Ht 61.75 in | Wt 118.0 lb

## 2012-07-01 DIAGNOSIS — H60549 Acute eczematoid otitis externa, unspecified ear: Secondary | ICD-10-CM | POA: Insufficient documentation

## 2012-07-01 DIAGNOSIS — E7800 Pure hypercholesterolemia, unspecified: Secondary | ICD-10-CM

## 2012-07-01 DIAGNOSIS — E119 Type 2 diabetes mellitus without complications: Secondary | ICD-10-CM

## 2012-07-01 DIAGNOSIS — I10 Essential (primary) hypertension: Secondary | ICD-10-CM

## 2012-07-01 DIAGNOSIS — L259 Unspecified contact dermatitis, unspecified cause: Secondary | ICD-10-CM

## 2012-07-01 DIAGNOSIS — H60543 Acute eczematoid otitis externa, bilateral: Secondary | ICD-10-CM

## 2012-07-01 LAB — HM DIABETES FOOT EXAM

## 2012-07-01 MED ORDER — TRIAMCINOLONE ACETONIDE 0.5 % EX CREA: TOPICAL_CREAM | Freq: Two times a day (BID) | CUTANEOUS | Status: DC

## 2012-07-01 NOTE — Patient Instructions (Addendum)
Schedule CPX in 6 months with fasting labs prior. Call sooner if fatigue is worsening. Work on regular exercise and healthy eating. Restart red yeast rice 2400 mg divided daily. Flax seeds ALA omega three fatty acid... 2000 mg divided daily. You can look into welchol to add to red yeast. Apply steroid cream in ear twice daily for 2 weeks, call if not improving.

## 2012-07-01 NOTE — Progress Notes (Signed)
Subjective:    Patient ID: Carla Campos, female    DOB: 07/30/1928, 79 y.o.   MRN: [id number]  HPI 79 year old female presents for 6 month follow up.  Doing well overall , no complaints at this time.   She notes swelling in left arm where recetn blood draw was, improving now.  Her husband is concerned that she may be anemia. She has been only mildly fatigued lately. She has been a little irritable with him lately. No CP and SOB.  No recent falls.  In past seeing Dr. Valentina Gu about hip pain. Improved with steroid injection and exercises.   No further issues in last year.  Diabetes: Well controlled on no med. Lab Results  Component Value Date   HGBA1C 6.3 06/27/2012  Hypoglycemic episodes:?  Hyperglycemic episodes:?  Feet problems:None  Blood Sugars averaging:not checking  eye exam within last year:yes   Hypertension: Well controlled on lisinopril 5 mg daily.  Using medication without problems or lightheadedness:  Chest pain with exertion:None  Edema:None  Short of breath:None  Average home BPs: at goal 130/80 Other issues:   Elevated Cholesterol:Inadequate control of LDL and tri. Pt on fish oil. Worsened control.  Stopped red yeast.. Never restarted. No SE with this. Lab Results  Component Value Date   CHOL 354* 06/27/2012   HDL 77.90 06/27/2012   LDLDIRECT 239.4 06/27/2012   TRIG 201.0* 06/27/2012   CHOLHDL 5 06/27/2012     Was on red yeast rice, but stopped due to ? Elevating Bp some... Not sure if this Caused, willing to restart.  Diet compliance:Good.  Exercise: walking 3 times a week.  Other complaints: REFUSES STATINS.   Carotid stenosis: re-eval with dopplers in 05/2010: moderate/severe B follow up in 6 months recommended.  She refused to continue to follow this.. She would not do CEA surgery even if indicated. She understands the risks and benefits and still refuses to follow or treat cholesterol aggressively.  No dizziness, no vision change.   Left ear  dryness, itches and swelling in ear canal. No pain. Has tried topical  Cream for this.    Review of Systems  Constitutional: Negative for fever and fatigue.  HENT: Negative for ear pain.   Eyes: Negative for pain.  Respiratory: Negative for chest tightness and shortness of breath.   Cardiovascular: Negative for chest pain, palpitations and leg swelling.  Gastrointestinal: Negative for abdominal pain.  Genitourinary: Negative for dysuria.       Objective:   Physical Exam  Constitutional: Vital signs are normal. She appears well-developed and well-nourished. She is cooperative.  Non-toxic appearance. She does not appear ill. No distress.  HENT:  Head: Normocephalic.  Right Ear: Hearing, tympanic membrane, external ear and ear canal normal. Tympanic membrane is not erythematous, not retracted and not bulging.  Left Ear: Hearing, tympanic membrane, external ear and ear canal normal. Tympanic membrane is not erythematous, not retracted and not bulging.  Nose: No mucosal edema or rhinorrhea. Right sinus exhibits no maxillary sinus tenderness and no frontal sinus tenderness. Left sinus exhibits no maxillary sinus tenderness and no frontal sinus tenderness.  Mouth/Throat: Uvula is midline, oropharynx is clear and moist and mucous membranes are normal.  Dry skin in B ear canals  Eyes: Conjunctivae, EOM and lids are normal. Pupils are equal, round, and reactive to light. No foreign bodies found.  Neck: Trachea normal and normal range of motion. Neck supple. Carotid bruit is not present. No mass and no thyromegaly present.  Cardiovascular: Normal  rate, regular rhythm, S1 normal, S2 normal, normal heart sounds, intact distal pulses and normal pulses.  Exam reveals no gallop and no friction rub.   No murmur heard. B bruit carotids  Pulmonary/Chest: Effort normal and breath sounds normal. Not tachypneic. No respiratory distress. She has no decreased breath sounds. She has no wheezes. She has no  rhonchi. She has no rales.  Abdominal: Soft. Normal appearance and bowel sounds are normal. There is no tenderness.  Neurological: She is alert.  Skin: Skin is warm, dry and intact. No rash noted.  Psychiatric: Her speech is normal and behavior is normal. Judgment and thought content normal. Her mood appears not anxious. Cognition and memory are normal. She does not exhibit a depressed mood.          Assessment & Plan:

## 2012-07-01 NOTE — Assessment & Plan Note (Signed)
Apply topical steroid cream

## 2012-07-01 NOTE — Assessment & Plan Note (Signed)
Well controlled. Continue current medication.  

## 2012-07-01 NOTE — Assessment & Plan Note (Signed)
Poor control. Restart red yeast rice and consider adding welchol.

## 2012-07-01 NOTE — Progress Notes (Signed)
79 year old female presents for 6 month follow up.  Doing well overall , no complaints at this time.   She notes swelling in left arm where recetn blood draw was, improving now.  Her husband is concerned that she may be anemia. She has been only mildly fatigued lately. She has been a little irritable with him lately. No CP and SOB.  No recent falls.  In past seeing Dr. Valentina Gu about hip pain. Improved with steroid injection and exercises.   No further issues in last year.  Diabetes: Well controlled on no med. Lab Results  Component Value Date   HGBA1C 6.3 06/27/2012  Hypoglycemic episodes:?  Hyperglycemic episodes:?  Feet problems:None  Blood Sugars averaging:not checking  eye exam within last year:yes   Hypertension: Well controlled on lisinopril 5 mg daily.  Using medication without problems or lightheadedness:  Chest pain with exertion:None  Edema:None  Short of breath:None  Average home BPs: at goal 130/80 Other issues:   Elevated Cholesterol:Inadequate control of LDL and tri. Pt on fish oil. Worsened control.  Stopped red yeast.. Never restarted. No SE with this. Lab Results  Component Value Date   CHOL 354* 06/27/2012   HDL 77.90 06/27/2012   LDLDIRECT 239.4 06/27/2012   TRIG 201.0* 06/27/2012   CHOLHDL 5 06/27/2012     Was on red yeast rice, but stopped due to ? Elevating Bp some... Not sure if this Caused, willing to restart.  Diet compliance:Good.  Exercise: walking 3 times a week.  Other complaints: REFUSES STATINS.   Carotid stenosis: re-eval with dopplers in 05/2010: moderate/severe B follow up in 6 months recommended.  She refused to continue to follow this.. She would not do CEA surgery even if indicated. She understands the risks and benefits and still refuses to follow or treat cholesterol aggressively.  No dizziness, no vision change.   Left ear dryness, itches and swelling in ear canal. No pain. Has tried topical  Cream for this.

## 2012-07-28 ENCOUNTER — Telehealth: Payer: Self-pay | Admitting: *Deleted

## 2012-07-28 DIAGNOSIS — R5381 Other malaise: Secondary | ICD-10-CM

## 2012-07-28 DIAGNOSIS — E7800 Pure hypercholesterolemia, unspecified: Secondary | ICD-10-CM

## 2012-07-28 NOTE — Telephone Encounter (Signed)
Orders only

## 2012-09-19 ENCOUNTER — Ambulatory Visit (INDEPENDENT_AMBULATORY_CARE_PROVIDER_SITE_OTHER): Payer: Medicare Other

## 2012-09-19 DIAGNOSIS — Z23 Encounter for immunization: Secondary | ICD-10-CM

## 2012-12-29 ENCOUNTER — Other Ambulatory Visit (INDEPENDENT_AMBULATORY_CARE_PROVIDER_SITE_OTHER): Payer: Medicare Other

## 2012-12-29 DIAGNOSIS — I10 Essential (primary) hypertension: Secondary | ICD-10-CM

## 2012-12-29 DIAGNOSIS — E7800 Pure hypercholesterolemia, unspecified: Secondary | ICD-10-CM

## 2012-12-29 DIAGNOSIS — R5381 Other malaise: Secondary | ICD-10-CM

## 2012-12-29 LAB — TSH: TSH: 2.25 u[IU]/mL (ref 0.35–5.50)

## 2012-12-29 LAB — CBC WITH DIFFERENTIAL/PLATELET
Eosinophils Relative: 5.9 % — ABNORMAL HIGH (ref 0.0–5.0)
Lymphocytes Relative: 33.1 % (ref 12.0–46.0)
Monocytes Absolute: 0.5 10*3/uL (ref 0.1–1.0)
Monocytes Relative: 6.7 % (ref 3.0–12.0)
Neutrophils Relative %: 53.3 % (ref 43.0–77.0)
Platelets: 251 10*3/uL (ref 150.0–400.0)
WBC: 7.2 10*3/uL (ref 4.5–10.5)

## 2012-12-29 LAB — COMPREHENSIVE METABOLIC PANEL
AST: 28 U/L (ref 0–37)
Albumin: 4.1 g/dL (ref 3.5–5.2)
Alkaline Phosphatase: 64 U/L (ref 39–117)
BUN: 14 mg/dL (ref 6–23)
CO2: 26 mEq/L (ref 19–32)
Creatinine, Ser: 0.8 mg/dL (ref 0.4–1.2)
GFR: 68.97 mL/min (ref >60.00)
Glucose, Bld: 131 mg/dL — ABNORMAL HIGH (ref 70–99)
Potassium: 3.9 mEq/L (ref 3.5–5.1)

## 2012-12-29 LAB — LIPID PANEL
HDL: 72.6 mg/dL (ref >39.00)
Total CHOL/HDL Ratio: 5
Triglycerides: 271 mg/dL — ABNORMAL HIGH (ref 0.0–149.0)
VLDL: 54.2 mg/dL — ABNORMAL HIGH (ref 0.0–40.0)

## 2012-12-29 LAB — LDL CHOLESTEROL, DIRECT: Direct LDL: 253.3 mg/dL

## 2013-01-02 ENCOUNTER — Ambulatory Visit (INDEPENDENT_AMBULATORY_CARE_PROVIDER_SITE_OTHER): Payer: Medicare Other | Admitting: Family Medicine

## 2013-01-02 ENCOUNTER — Ambulatory Visit: Payer: Medicare Other

## 2013-01-02 ENCOUNTER — Encounter: Payer: Self-pay | Admitting: Family Medicine

## 2013-01-02 VITALS — BP 144/82 | HR 97 | Temp 98.1°F | Ht 61.25 in | Wt 119.5 lb

## 2013-01-02 DIAGNOSIS — I658 Occlusion and stenosis of other precerebral arteries: Secondary | ICD-10-CM

## 2013-01-02 DIAGNOSIS — E119 Type 2 diabetes mellitus without complications: Secondary | ICD-10-CM

## 2013-01-02 DIAGNOSIS — Z0000 Encounter for general adult medical examination without abnormal findings: Secondary | ICD-10-CM

## 2013-01-02 DIAGNOSIS — I6529 Occlusion and stenosis of unspecified carotid artery: Secondary | ICD-10-CM

## 2013-01-02 DIAGNOSIS — I6523 Occlusion and stenosis of bilateral carotid arteries: Secondary | ICD-10-CM

## 2013-01-02 DIAGNOSIS — I10 Essential (primary) hypertension: Secondary | ICD-10-CM

## 2013-01-02 DIAGNOSIS — E7800 Pure hypercholesterolemia, unspecified: Secondary | ICD-10-CM

## 2013-01-02 LAB — HM DIABETES FOOT EXAM

## 2013-01-02 LAB — HEMOGLOBIN A1C: Hgb A1c MFr Bld: 6.2 % (ref 4.6–6.5)

## 2013-01-02 NOTE — Assessment & Plan Note (Signed)
Restart baby aspirin

## 2013-01-02 NOTE — Progress Notes (Signed)
Pre-visit discussion using our clinic review tool. No additional management support is needed unless otherwise documented below in the visit note.  

## 2013-01-02 NOTE — Progress Notes (Deleted)
   Subjective:    Patient ID: Carla Campos, female    DOB: 1928/10/15, 80 y.o.   MRN: [id number]  HPI    Review of Systems     Objective:   Physical Exam        Assessment & Plan:

## 2013-01-02 NOTE — Assessment & Plan Note (Signed)
Very poor control.. Refuses statin or welchol ie any prescription med for this.  Encouraged exercise, weight loss, healthy eating habits.  Recommend better compliance with red yeast rice.

## 2013-01-02 NOTE — Patient Instructions (Addendum)
Restart aspirin. Given fluctuating BPs restart lisinopril, follow BP at home. Call if running to low < 90/60 to too high 140/90. Remember the red yeast rice 2 tabs twice daily. Keep working on [employer] diet and exercise.

## 2013-01-02 NOTE — Assessment & Plan Note (Signed)
Inadequate control.Marland Kitchen. Restart lisinopril 5 mg daily.

## 2013-01-02 NOTE — Progress Notes (Signed)
I have personally reviewed the Medicare Annual Wellness questionnaire and have noted  1. The patient's medical and social history  2. Their use of alcohol, tobacco or illicit drugs  3. Their current medications and supplements  4. The patient's functional ability including ADL's, fall risks, home safety risks and hearing or visual  impairment.  5. Diet and physical activities  6. Evidence for depression or mood disorders  The patients weight, height, BMI and visual acuity have been recorded in the chart  I have made referrals, counseling and provided education to the patient based review of the above and I have provided the pt with a written personalized care plan for preventive services.   Doing well overall , no complaints at this time.   She did have some diarrhea yesterday, abdominal cramping, now resolved. No fever.  No recent falls, none in last year.  Seeing Dr. Valentina Gu about hip pain. Improved with steroid injection in 03/2012 and exercises.   Diabetes: Well controlled on no med. Has been eating more sweets over the holiday compared to usual.  Due for re-eval labs. Lab Results  Component Value Date   HGBA1C 6.3 06/27/2012  Hypoglycemic episodes:?  Hyperglycemic episodes:?  Feet problems:None  Blood Sugars averaging: Not checking  eye exam within last year: 06/2012  Hypertension: Well controlled on lisinopril 5 mg daily previously todays BP slightly high.  She had stopped lisinopril  Because  Had one BP 86/52 She has had 129-190/70-91 BP Readings from Last 3 Encounters:  01/02/13 144/82  07/01/12 130/82  05/05/12 140/78  Using medication without problems or lightheadedness:  Chest pain with exertion:None  Edema:None  Short of breath:None  Average home BPs: at goal 130/80 Other issues:   Elevated Cholesterol:Very poor  control of LDL and tri. Pt on fish oil.  She is not taking red yeast rice regularly ( she hates taking pills, no SE), consider trying welchol Lab Results   Component Value Date   CHOL 353* 12/29/2012   HDL 72.60 12/29/2012   LDLDIRECT 253.3 12/29/2012   TRIG 271.0* 12/29/2012   CHOLHDL 5 12/29/2012  Diet compliance:Good.  Exercise: walking 3 times a week.  Other complaints: REFUSES STATINS.   Carotid stenosis:  05/2010: moderate/severe B  She continues to refuse to follow this.. She would not do CEA surgery even if indicated. She understands the risks and benefits and still refuses to follow or treat cholesterol aggressively.   Review of Systems  Constitutional: Negative for fever, fatigue and unexpected weight change.  HENT: Negative for ear pain, congestion, sore throat, sneezing, trouble swallowing and sinus pressure.  Eyes: Negative for pain and itching.  Respiratory: Negative for cough, shortness of breath and wheezing.  Cardiovascular: Negative for chest pain, palpitations and leg swelling.  Gastrointestinal: Negative for nausea, abdominal pain, diarrhea, constipation and blood in stool.  Genitourinary: Negative for dysuria, hematuria, vaginal discharge, difficulty urinating and menstrual problem.  Skin: Negative for rash.  Neurological: Negative for syncope, weakness, light-headedness, numbness and headaches.  Psychiatric/Behavioral: Negative for confusion and dysphoric mood. The patient is not nervous/anxious.  Objective:   Physical Exam  Constitutional: She is oriented to person, place, and time. Vital signs are normal. She appears well-developed and well-nourished. She is cooperative. Non-toxic appearance. She does not appear ill. No distress.  HENT:  Head: Normocephalic.  Right Ear: Hearing, tympanic membrane, external ear and ear canal normal.  Left Ear: Hearing, tympanic membrane, external ear and ear canal normal.  Nose: Nose normal.  Eyes: Conjunctivae, EOM  and lids are normal. Pupils are equal, round, and reactive to light. No foreign bodies found.  Neck: Trachea normal. Carotid bruit IS present BILATERALLY greater on  left than reight. No mass and no thyromegaly present.  Cardiovascular: Normal rate, regular rhythm, S1 normal, S2 normal, normal heart sounds and intact distal pulses. Exam reveals no gallop.  No murmur heard.  Pulmonary/Chest: Effort normal and breath sounds normal. No respiratory distress. She has no wheezes. She has no rhonchi. She has no rales.  Abdominal: Soft. Normal appearance and bowel sounds are normal. She exhibits no distension, no fluid wave, no abdominal bruit and no mass. There is no hepatosplenomegaly. There is no tenderness. There is no rebound, no guarding and no CVA tenderness. No hernia.  Lymphadenopathy:  She has no cervical adenopathy.  She has no axillary adenopathy.  Neurological: She is alert and oriented to person, place, and time. She has normal strength. No cranial nerve deficit or sensory deficit. She exhibits normal muscle tone.  Skin: Skin is warm, dry and intact. No rash noted.  Psychiatric: Her speech is normal and behavior is normal. Judgment normal. Her mood appears not anxious. Cognition and memory are normal. She does not exhibit a depressed mood.  Diabetic foot exam:  Normal inspection  No skin breakdown  No calluses  Normal DP pulses  Normal sensation to light touch and monofilament  Nails thickened and slightly yellow  Assessment & Plan:   AMW: The patient's preventative maintenance and recommended screening tests for an annual wellness exam were reviewed in full today.  Brought up to date unless services declined.  Counselled on the importance of diet, exercise, and its role in overall health and mortality.  The patient's FH and SH was reviewed, including their home life, tobacco status, and drug and alcohol status.   Vaccines:Uptodate with flu, PNA, Td,[facility]  VHQ:IONGEXBXA:refuses to continue evaluating this, she is taking calcium and getting weight bearing exercsie.  Mammogram:Not indicated.  DVE/pap: not indicated  Colon: stool  cards neg 2011, no colonoscopy eval indicated.

## 2013-01-05 ENCOUNTER — Encounter: Payer: Self-pay | Admitting: *Deleted

## 2013-02-02 ENCOUNTER — Other Ambulatory Visit: Payer: Self-pay | Admitting: Family Medicine

## 2013-02-11 ENCOUNTER — Ambulatory Visit: Payer: Medicare Other | Admitting: Family Medicine

## 2013-02-12 ENCOUNTER — Ambulatory Visit (INDEPENDENT_AMBULATORY_CARE_PROVIDER_SITE_OTHER): Payer: Medicare Other | Admitting: Family Medicine

## 2013-02-12 ENCOUNTER — Encounter: Payer: Self-pay | Admitting: Family Medicine

## 2013-02-12 VITALS — BP 188/88 | HR 86 | Temp 98.5°F | Ht 61.25 in | Wt 115.2 lb

## 2013-02-12 DIAGNOSIS — R42 Dizziness and giddiness: Secondary | ICD-10-CM

## 2013-02-12 DIAGNOSIS — R143 Flatulence: Secondary | ICD-10-CM

## 2013-02-12 DIAGNOSIS — I658 Occlusion and stenosis of other precerebral arteries: Secondary | ICD-10-CM

## 2013-02-12 DIAGNOSIS — R140 Abdominal distension (gaseous): Secondary | ICD-10-CM | POA: Insufficient documentation

## 2013-02-12 DIAGNOSIS — R142 Eructation: Secondary | ICD-10-CM

## 2013-02-12 DIAGNOSIS — I10 Essential (primary) hypertension: Secondary | ICD-10-CM

## 2013-02-12 DIAGNOSIS — R141 Gas pain: Secondary | ICD-10-CM

## 2013-02-12 DIAGNOSIS — I6529 Occlusion and stenosis of unspecified carotid artery: Secondary | ICD-10-CM

## 2013-02-12 NOTE — Progress Notes (Signed)
Pre-visit discussion using our clinic review tool. No additional management support is needed unless otherwise documented below in the visit note.  

## 2013-02-12 NOTE — Patient Instructions (Addendum)
Elimintate lactose from diet. Start probiotic daily.Karn Pickler. Align. Follow and record BPs twice daily, bring to next OV. Follow up in 2 weeks 30 in OV. Call sooner if symptoms worsening.

## 2013-02-12 NOTE — Assessment & Plan Note (Signed)
Orthostatics normal.  On low dose lisinopril, BPs fluctuating at home.

## 2013-02-12 NOTE — Progress Notes (Signed)
Subjective:    Patient ID: Carla Campos, female    DOB: Dec 13, 1928, 80 y.o.   MRN: [id number]  Abdominal Pain Pertinent negatives include no fever.    80 year old female with history of HTN, well controlled DM, GERD presents to clinic today with recurrent episodes of gas, abdominal bloating, diffuse upper abdominal pain. Lasts for 1 day, feels weak afterward, rests/ bland diet RICE then gets back to usual. Has occurred twice now. Occurred last after gluten free cereal with milk. Usually uses coconut milk.  She used some activated charcoal which helped bring up the charcoal. She threw up after this, but felt better. She has been trying gluten free diet in last 7-8 days.   No diarrhea, she is somewhat constipated in last few days, BM daily but straining. No emesis.   She has been feeling very depressed in as week since she has been sick.  Husband told her to state that sadness happens once a month. She is not interested in medication to treat.  BP Readings from Last 3 Encounters:  02/12/13 198/92  01/02/13 144/82  07/01/12 130/82  BPs have been fluctuating at home in last few months.   Has not been taking vitamins lately.  Cbc, ths , CMET nml in 12/2012.  Review of Systems  Constitutional: Positive for fatigue. Negative for fever.  HENT: Negative for ear pain.   Eyes: Negative for pain.  Respiratory: Negative for shortness of breath.   Cardiovascular: Negative for chest pain.  Gastrointestinal: Positive for abdominal pain. Negative for anal bleeding.  Neurological: Positive for light-headedness.       No vertigo Occ has had episodes of feeling presyncopal associated with nausea, went away after few moments of lying down.       Objective:   Physical Exam  Constitutional: Vital signs are normal. She appears well-developed and well-nourished. She is cooperative.  Non-toxic appearance. She does not appear ill. No distress.  HENT:  Head: Normocephalic.  Right Ear:  Hearing, tympanic membrane, external ear and ear canal normal. Tympanic membrane is not erythematous, not retracted and not bulging.  Left Ear: Hearing, tympanic membrane, external ear and ear canal normal. Tympanic membrane is not erythematous, not retracted and not bulging.  Nose: No mucosal edema or rhinorrhea. Right sinus exhibits no maxillary sinus tenderness and no frontal sinus tenderness. Left sinus exhibits no maxillary sinus tenderness and no frontal sinus tenderness.  Mouth/Throat: Uvula is midline, oropharynx is clear and moist and mucous membranes are normal.  Eyes: Conjunctivae, EOM and lids are normal. Pupils are equal, round, and reactive to light. Lids are everted and swept, no foreign bodies found.  Neck: Trachea normal and normal range of motion. Neck supple. Carotid bruit is not present. No mass and no thyromegaly present.  Cardiovascular: Normal rate, regular rhythm, S1 normal, S2 normal, normal heart sounds, intact distal pulses and normal pulses.  Exam reveals no gallop and no friction rub.   No murmur heard. Pulmonary/Chest: Effort normal and breath sounds normal. Not tachypneic. No respiratory distress. She has no decreased breath sounds. She has no wheezes. She has no rhonchi. She has no rales.  Abdominal: Soft. Normal appearance. Bowel sounds are increased. There is no hepatosplenomegaly. There is generalized tenderness. There is no rigidity, no rebound, no guarding, no CVA tenderness and negative Murphy's sign.  Neurological: She is alert.  Skin: Skin is warm, dry and intact. No rash noted.  Psychiatric: Her speech is normal and behavior is normal. Judgment and thought  content normal. Her mood appears not anxious. Cognition and memory are normal. She does not exhibit a depressed mood.          Assessment & Plan:

## 2013-02-12 NOTE — Assessment & Plan Note (Signed)
She will follow at home.. If reamining high... Will consider increase in med, but hesitate to given pt reports some lows.

## 2013-02-12 NOTE — Assessment & Plan Note (Signed)
?   Lactose intolerance. Stop lactose in diet.  If gluten free has not helped, okay to return to gluten.  Start probiotic daily.  Miralax for constipation, increase water.

## 2013-02-13 ENCOUNTER — Telehealth: Payer: Self-pay | Admitting: Family Medicine

## 2013-02-13 NOTE — Telephone Encounter (Signed)
Relevant patient education mailed to patient.  

## 2013-02-27 ENCOUNTER — Encounter: Payer: Self-pay | Admitting: Family Medicine

## 2013-02-27 ENCOUNTER — Ambulatory Visit (INDEPENDENT_AMBULATORY_CARE_PROVIDER_SITE_OTHER): Payer: Medicare Other | Admitting: Family Medicine

## 2013-02-27 VITALS — BP 176/89 | HR 90 | Temp 98.6°F | Ht 61.25 in | Wt 115.5 lb

## 2013-02-27 DIAGNOSIS — I658 Occlusion and stenosis of other precerebral arteries: Secondary | ICD-10-CM

## 2013-02-27 DIAGNOSIS — R140 Abdominal distension (gaseous): Secondary | ICD-10-CM

## 2013-02-27 DIAGNOSIS — R141 Gas pain: Secondary | ICD-10-CM

## 2013-02-27 DIAGNOSIS — R143 Flatulence: Secondary | ICD-10-CM

## 2013-02-27 DIAGNOSIS — I6529 Occlusion and stenosis of unspecified carotid artery: Secondary | ICD-10-CM

## 2013-02-27 DIAGNOSIS — R142 Eructation: Secondary | ICD-10-CM

## 2013-02-27 DIAGNOSIS — R42 Dizziness and giddiness: Secondary | ICD-10-CM

## 2013-02-27 DIAGNOSIS — I10 Essential (primary) hypertension: Secondary | ICD-10-CM

## 2013-02-27 NOTE — Progress Notes (Signed)
Pre visit review using our clinic review tool, if applicable. No additional management support is needed unless otherwise documented below in the visit note. 

## 2013-02-27 NOTE — Assessment & Plan Note (Signed)
Resolved with lactose free diet and probiotic. Continue.

## 2013-02-27 NOTE — Progress Notes (Signed)
   Subjective:    Patient ID: Carla Campos, female    DOB: [birth year], 80 y.o.   MRN: [id number]  HPI 80 year old female with hx of DM, HTN presents for 2 week follow up on fluctuating BPs and abdominal bloating.   At last OV: Recommended:  Elimintate lactose from diet.  Start probiotic daily.Karn Pickler. Align.  Follow and record BPs twice daily, bring to next OV.  Today she reports:  She is much better stopping gluten and lactose. She also thinks probiotic has helped. No pain. She is somewhat gassy, but no pain. Her mood has improved with feeling better.  Her BP is still more elevated than goal only one low.  HTR 800-100.    Review of Systems  Constitutional: Negative for fever and fatigue.  HENT: Negative for ear pain.   Eyes: Negative for pain.  Respiratory: Negative for chest tightness and shortness of breath.   Cardiovascular: Negative for chest pain, palpitations and leg swelling.  Gastrointestinal: Negative for abdominal pain.  Genitourinary: Negative for dysuria.       Objective:   Physical Exam  Constitutional: Vital signs are normal. She appears well-developed and well-nourished. She is cooperative.  Non-toxic appearance. She does not appear ill. No distress.  HENT:  Head: Normocephalic.  Right Ear: Hearing, tympanic membrane, external ear and ear canal normal. Tympanic membrane is not erythematous, not retracted and not bulging.  Left Ear: Hearing, tympanic membrane, external ear and ear canal normal. Tympanic membrane is not erythematous, not retracted and not bulging.  Nose: No mucosal edema or rhinorrhea. Right sinus exhibits no maxillary sinus tenderness and no frontal sinus tenderness. Left sinus exhibits no maxillary sinus tenderness and no frontal sinus tenderness.  Mouth/Throat: Uvula is midline, oropharynx is clear and moist and mucous membranes are normal.  Eyes: Conjunctivae, EOM and lids are normal. Pupils are equal, round, and reactive to light. Lids are  everted and swept, no foreign bodies found.  Neck: Trachea normal and normal range of motion. Neck supple. Carotid bruit is not present. No mass and no thyromegaly present.  Cardiovascular: Normal rate, regular rhythm, S1 normal, S2 normal, normal heart sounds, intact distal pulses and normal pulses.  Exam reveals no gallop and no friction rub.   No murmur heard. Pulmonary/Chest: Effort normal and breath sounds normal. Not tachypneic. No respiratory distress. She has no decreased breath sounds. She has no wheezes. She has no rhonchi. She has no rales.  Abdominal: Soft. Normal appearance and bowel sounds are normal. There is no tenderness.  Neurological: She is alert.  Skin: Skin is warm, dry and intact. No rash noted.  Psychiatric: Her speech is normal and behavior is normal. Judgment and thought content normal. Her mood appears not anxious. Cognition and memory are normal. She does not exhibit a depressed mood.          Assessment & Plan:

## 2013-02-27 NOTE — Patient Instructions (Signed)
Increase Lisinopril to 7.5 mg daily. Continue to follow your blood pressures.. Call if running < 90/60 or > 140/90. Continue the probiotic and lactose free diet.

## 2013-02-27 NOTE — Assessment & Plan Note (Signed)
Resolved

## 2013-02-27 NOTE — Assessment & Plan Note (Signed)
Remains poorly controlled.. Increase to 7.5 mg lisinopril daily.

## 2013-03-02 ENCOUNTER — Telehealth: Payer: Self-pay | Admitting: Family Medicine

## 2013-03-02 NOTE — Telephone Encounter (Signed)
Relevant patient education mailed to patient.  

## 2013-03-17 ENCOUNTER — Telehealth: Payer: Self-pay | Admitting: *Deleted

## 2013-03-17 NOTE — Telephone Encounter (Signed)
She should get prevnar, but I believe it is on back order... Right

## 2013-03-17 NOTE — Telephone Encounter (Signed)
Patient left a voicemail that she is interested in getting the PCV 13 vaccine. Please advise if she should get this?

## 2013-03-17 NOTE — Telephone Encounter (Signed)
Left message with Mr. [name] to have Carla HashimotoPatricia call our office and schedule nurse visit for Prevnar vaccine.

## 2013-03-20 ENCOUNTER — Telehealth: Payer: Self-pay

## 2013-03-20 NOTE — Telephone Encounter (Signed)
Pt left v/m pt request to get PCV 13 vaccine;Please advise.

## 2013-03-20 NOTE — Telephone Encounter (Signed)
We have this again. She can come in any time for this.

## 2013-03-23 NOTE — Telephone Encounter (Signed)
appt scheduled and verified with pt on 03/26/13 at 10AM for PCV 13 vaccine.

## 2013-03-26 ENCOUNTER — Ambulatory Visit (INDEPENDENT_AMBULATORY_CARE_PROVIDER_SITE_OTHER): Payer: Medicare Other

## 2013-03-26 DIAGNOSIS — Z23 Encounter for immunization: Secondary | ICD-10-CM

## 2013-06-24 ENCOUNTER — Telehealth: Payer: Self-pay | Admitting: Family Medicine

## 2013-06-24 DIAGNOSIS — E7800 Pure hypercholesterolemia, unspecified: Secondary | ICD-10-CM

## 2013-06-24 DIAGNOSIS — E119 Type 2 diabetes mellitus without complications: Secondary | ICD-10-CM

## 2013-06-24 NOTE — Telephone Encounter (Signed)
Message copied by Excell SeltzerBEDSOLE, Amariz Flamenco E on Wed Jun 24, 2013 10:20 PM ------      Message from: Alvina ChouWALSH, TERRI J      Created: Tue Jun 16, 2013  3:49 PM      Regarding: Lab orders for Thursday, 6.25.15       Lab orders for a f/u appt ------

## 2013-06-25 ENCOUNTER — Other Ambulatory Visit (INDEPENDENT_AMBULATORY_CARE_PROVIDER_SITE_OTHER): Payer: Medicare Other

## 2013-06-25 DIAGNOSIS — E7800 Pure hypercholesterolemia, unspecified: Secondary | ICD-10-CM

## 2013-06-25 DIAGNOSIS — E119 Type 2 diabetes mellitus without complications: Secondary | ICD-10-CM

## 2013-06-25 LAB — COMPREHENSIVE METABOLIC PANEL
ALK PHOS: 68 U/L (ref 39–117)
ALT: 19 U/L (ref 0–35)
AST: 29 U/L (ref 0–37)
Albumin: 4.2 g/dL (ref 3.5–5.2)
BUN: 19 mg/dL (ref 6–23)
CO2: 30 mEq/L (ref 19–32)
CREATININE: 0.8 mg/dL (ref 0.4–1.2)
Calcium: 9.6 mg/dL (ref 8.4–10.5)
Chloride: 105 mEq/L (ref 96–112)
GFR: 76.16 mL/min (ref >60.00)
Glucose, Bld: 118 mg/dL — ABNORMAL HIGH (ref 70–99)
Potassium: 4.4 mEq/L (ref 3.5–5.1)
SODIUM: 142 meq/L (ref 135–145)
TOTAL PROTEIN: 7.1 g/dL (ref 6.0–8.3)
Total Bilirubin: 0.6 mg/dL (ref 0.2–1.2)

## 2013-06-25 LAB — LIPID PANEL
Cholesterol: 330 mg/dL — ABNORMAL HIGH (ref 0–200)
HDL: 91.4 mg/dL (ref >39.00)
LDL Cholesterol: 215 mg/dL — ABNORMAL HIGH (ref 0–99)
NonHDL: 238.6
TRIGLYCERIDES: 119 mg/dL (ref 0.0–149.0)
Total CHOL/HDL Ratio: 4
VLDL: 23.8 mg/dL (ref 0.0–40.0)

## 2013-06-25 LAB — HEMOGLOBIN A1C: Hgb A1c MFr Bld: 6.1 % (ref 4.6–6.5)

## 2013-07-02 ENCOUNTER — Encounter: Payer: Self-pay | Admitting: Family Medicine

## 2013-07-02 ENCOUNTER — Ambulatory Visit (INDEPENDENT_AMBULATORY_CARE_PROVIDER_SITE_OTHER): Payer: Medicare Other | Admitting: Family Medicine

## 2013-07-02 VITALS — BP 150/80 | HR 92 | Temp 98.8°F | Ht 61.25 in | Wt 110.0 lb

## 2013-07-02 DIAGNOSIS — I10 Essential (primary) hypertension: Secondary | ICD-10-CM

## 2013-07-02 DIAGNOSIS — I658 Occlusion and stenosis of other precerebral arteries: Secondary | ICD-10-CM

## 2013-07-02 DIAGNOSIS — E7800 Pure hypercholesterolemia, unspecified: Secondary | ICD-10-CM

## 2013-07-02 DIAGNOSIS — I6529 Occlusion and stenosis of unspecified carotid artery: Secondary | ICD-10-CM

## 2013-07-02 DIAGNOSIS — E119 Type 2 diabetes mellitus without complications: Secondary | ICD-10-CM

## 2013-07-02 DIAGNOSIS — I6523 Occlusion and stenosis of bilateral carotid arteries: Secondary | ICD-10-CM

## 2013-07-02 NOTE — Progress Notes (Signed)
Pre visit review using our clinic review tool, if applicable. No additional management support is needed unless otherwise documented below in the visit note. 

## 2013-07-02 NOTE — Patient Instructions (Signed)
Increase lisinopril at home to 1 and 1/2 tablets daily. Follow BP at home, goal < 130/80. Look into welchol for cholesterol control. Scheduled medicare wellness in 6 months with labs prior.  Work on low cholesterol diet and exercise.

## 2013-07-02 NOTE — Progress Notes (Addendum)
Subjective:    Patient ID: Carla Campos, female    DOB: [birth year], 80 y.o.   MRN: [id number]  HPI 80 year old female presents for 6 month follow up.  Doing well overall , no complaints at this time.   No CP and SOB.  No recent falls.   Diabetes: Well controlled on no med.  Lab Results  Component Value Date   HGBA1C 6.1 06/25/2013  Hypoglycemic episodes:?  Hyperglycemic episodes:?  Feet problems:None  Blood Sugars averaging: not checking  eye exam within last year:due  Hypertension: Well controlled on lisinopril 5 mg daily.   BP Readings from Last 3 Encounters:  07/02/13 150/80  02/27/13 176/89  02/12/13 188/88  Using medication without problems or lightheadedness:  Chest pain with exertion:None  Edema:None  Short of breath:None  Average home BPs: at goal 146/82 Other issues:   Elevated Cholesterol:Inadequate control of LDL and tri. Pt on fish oil. Worsened control.  Goal  < 70. Stopped red yeast.. Restarted. ? SE with this.  Lab Results  Component Value Date   CHOL 330* 06/25/2013   HDL 91.40 06/25/2013   LDLCALC 215* 06/25/2013   LDLDIRECT 253.3 12/29/2012   TRIG 119.0 06/25/2013   CHOLHDL 4 06/25/2013  Diet compliance:Good.  Exercise: walking 3 times a week.  Other complaints: REFUSES STATINS.   Carotid stenosis: re-eval with dopplers in 05/2010: moderate/severe B follow up in 6 months recommended.  She refused to continue to follow this.. She would not do CEA surgery even if indicated. She understands the risks and benefits and still refuses to follow or treat cholesterol aggressively.  No dizziness, no vision change.      Review of Systems  Unable to perform ROS Constitutional: Negative for fever and fatigue.  HENT: Negative for ear pain.   Eyes: Negative for pain.  Respiratory: Negative for chest tightness and shortness of breath.   Cardiovascular: Negative for chest pain, palpitations and leg swelling.  Gastrointestinal: Negative for abdominal pain.   Genitourinary: Negative for dysuria.       Objective:   Physical Exam  Constitutional: Vital signs are normal. She appears well-developed and well-nourished. She is cooperative.  Non-toxic appearance. She does not appear ill. No distress.  HENT:  Head: Normocephalic.  Right Ear: Hearing, tympanic membrane, external ear and ear canal normal. Tympanic membrane is not erythematous, not retracted and not bulging.  Left Ear: Hearing, tympanic membrane, external ear and ear canal normal. Tympanic membrane is not erythematous, not retracted and not bulging.  Nose: No mucosal edema or rhinorrhea. Right sinus exhibits no maxillary sinus tenderness and no frontal sinus tenderness. Left sinus exhibits no maxillary sinus tenderness and no frontal sinus tenderness.  Mouth/Throat: Uvula is midline, oropharynx is clear and moist and mucous membranes are normal.  Eyes: Conjunctivae, EOM and lids are normal. Pupils are equal, round, and reactive to light. Lids are everted and swept, no foreign bodies found.  Neck: Trachea normal and normal range of motion. Neck supple. Carotid bruit is not present. No mass and no thyromegaly present.  Cardiovascular: Normal rate, regular rhythm, S1 normal, S2 normal, normal heart sounds, intact distal pulses and normal pulses.  Exam reveals no gallop and no friction rub.   No murmur heard. Pulmonary/Chest: Effort normal and breath sounds normal. Not tachypneic. No respiratory distress. She has no decreased breath sounds. She has no wheezes. She has no rhonchi. She has no rales.  Abdominal: Soft. Normal appearance and bowel sounds are normal. There is no tenderness.  Neurological: She is alert.  Skin: Skin is warm, dry and intact. No rash noted.  Psychiatric: Her speech is normal and behavior is normal. Judgment and thought content normal. Her mood appears not anxious. Cognition and memory are normal. She does not exhibit a depressed mood.          Assessment & Plan:

## 2013-07-20 ENCOUNTER — Other Ambulatory Visit: Payer: Self-pay | Admitting: Family Medicine

## 2013-07-23 NOTE — Assessment & Plan Note (Signed)
Look into welchol for cholesterol control. Encouraged exercise, weight loss, healthy eating habits.

## 2013-07-23 NOTE — Assessment & Plan Note (Signed)
Well controlled. Continue current medication. Encouraged exercise, weight loss, healthy eating habits.  

## 2013-07-23 NOTE — Assessment & Plan Note (Signed)
Pt refuses to follow as she would not have CEA done even if critical.

## 2013-07-23 NOTE — Assessment & Plan Note (Addendum)
Increase lisinopril at home to 1 and 1/2 tablets daily. Follow BP at home, goal < 130/80.

## 2013-09-05 LAB — HM DIABETES EYE EXAM

## 2013-12-29 ENCOUNTER — Other Ambulatory Visit: Payer: Medicare Other

## 2013-12-30 ENCOUNTER — Telehealth: Payer: Self-pay | Admitting: Family Medicine

## 2013-12-30 ENCOUNTER — Other Ambulatory Visit (INDEPENDENT_AMBULATORY_CARE_PROVIDER_SITE_OTHER): Payer: Medicare Other

## 2013-12-30 DIAGNOSIS — E7800 Pure hypercholesterolemia, unspecified: Secondary | ICD-10-CM

## 2013-12-30 DIAGNOSIS — E119 Type 2 diabetes mellitus without complications: Secondary | ICD-10-CM

## 2013-12-30 LAB — LIPID PANEL
Cholesterol: 319 mg/dL — ABNORMAL HIGH (ref 0–200)
HDL: 77.1 mg/dL (ref >39.00)
LDL Cholesterol: 215 mg/dL — ABNORMAL HIGH (ref 0–99)
NonHDL: 241.9
Total CHOL/HDL Ratio: 4
Triglycerides: 133 mg/dL (ref 0.0–149.0)
VLDL: 26.6 mg/dL (ref 0.0–40.0)

## 2013-12-30 LAB — COMPREHENSIVE METABOLIC PANEL
ALBUMIN: 4 g/dL (ref 3.5–5.2)
ALT: 23 U/L (ref 0–35)
AST: 28 U/L (ref 0–37)
Alkaline Phosphatase: 77 U/L (ref 39–117)
BILIRUBIN TOTAL: 1.2 mg/dL (ref 0.2–1.2)
BUN: 19 mg/dL (ref 6–23)
CALCIUM: 9.4 mg/dL (ref 8.4–10.5)
CO2: 25 mEq/L (ref 19–32)
Chloride: 109 mEq/L (ref 96–112)
Creatinine, Ser: 0.8 mg/dL (ref 0.4–1.2)
GFR: 77.22 mL/min (ref >60.00)
Glucose, Bld: 124 mg/dL — ABNORMAL HIGH (ref 70–99)
Potassium: 4 mEq/L (ref 3.5–5.1)
Sodium: 144 mEq/L (ref 135–145)
TOTAL PROTEIN: 7 g/dL (ref 6.0–8.3)

## 2013-12-30 LAB — HEMOGLOBIN A1C: HEMOGLOBIN A1C: 6.4 % (ref 4.6–6.5)

## 2013-12-30 NOTE — Telephone Encounter (Signed)
-----   Message from Alvina Chouerri J Walsh sent at 12/22/2013  3:29 PM EST ----- Regarding: Lab orders for Wednesday, 12.30.15 Patient is scheduled for CPX labs, please order future labs, Thanks , Camelia Engerri

## 2014-01-05 ENCOUNTER — Ambulatory Visit (INDEPENDENT_AMBULATORY_CARE_PROVIDER_SITE_OTHER): Payer: Medicare Other | Admitting: Family Medicine

## 2014-01-05 ENCOUNTER — Encounter: Payer: Self-pay | Admitting: Family Medicine

## 2014-01-05 VITALS — BP 140/84 | HR 88 | Temp 98.3°F | Ht 61.25 in | Wt 106.5 lb

## 2014-01-05 DIAGNOSIS — Z0000 Encounter for general adult medical examination without abnormal findings: Secondary | ICD-10-CM

## 2014-01-05 DIAGNOSIS — J018 Other acute sinusitis: Secondary | ICD-10-CM

## 2014-01-05 DIAGNOSIS — J019 Acute sinusitis, unspecified: Secondary | ICD-10-CM | POA: Insufficient documentation

## 2014-01-05 DIAGNOSIS — Z7189 Other specified counseling: Secondary | ICD-10-CM

## 2014-01-05 LAB — HM DIABETES FOOT EXAM

## 2014-01-05 MED ORDER — GUAIFENESIN-CODEINE 100-10 MG/5ML PO SYRP: 5.0000 mL | ORAL_SOLUTION | Freq: Every evening | ORAL | Status: DC | PRN

## 2014-01-05 NOTE — Patient Instructions (Addendum)
Consider retrying red yeast rice 1200 mg twice.  Continue regular exercise. Complete the living will and set up HCPOA.  USE COUGH MED FOR COUGH AT NIGHT, CALL IF NOT IMPROVING AS EXPECTED.

## 2014-01-05 NOTE — Assessment & Plan Note (Signed)
Improving on doxy. Will prescribe codeine guaifenesin for cough.

## 2014-01-05 NOTE — Progress Notes (Signed)
Pre visit review using our clinic review tool, if applicable. No additional management support is needed unless otherwise documented below in the visit note. 

## 2014-01-05 NOTE — Progress Notes (Signed)
I have personally reviewed the Medicare Annual Wellness questionnaire and have noted  1. The patient's medical and social history  2. Their use of alcohol, tobacco or illicit drugs  3. Their current medications and supplements  4. The patient's functional ability including ADL's, fall risks, home safety risks and hearing or visual  impairment.  5. Diet and physical activities  6. Evidence for depression or mood disorders  The patients weight, height, BMI and visual acuity have been recorded in the chart  I have made referrals, counseling and provided education to the patient based review of the above and I have provided the pt with a written personalized care plan for preventive services.   Doing well overall , She has ben having 1 week of sinus infection symptoms. She is on doxy x 10 days.  Improving her symptoms slowly but surely. No fever. She continues to have cough keeping her up at night, codeine cough syrup has helped in [place][past.   She has been avoiding gluten and lactose. Gi issues are improved. No diarrhea and bloating. No recent falls, none in last year.  Seeing Dr. Valentina GuLucy about hip pain. Improved with steroid injection in 03/2012 and exercises.   Diabetes: Well controlled on no med. Has been eating more sweets over the holiday compared to usual.  Lab Results  Component Value Date   HGBA1C 6.4 12/30/2013  Hypoglycemic episodes:?  Hyperglycemic episodes:?  Feet problems:None  Blood Sugars averaging: Not checking  eye exam within last year: 09/2013  Hypertension: Well controlled on lisinopril 7.5 mg daily  BP Readings from Last 3 Encounters:  01/05/14 140/84  07/02/13 150/80  02/27/13 176/89  Using medication without problems or lightheadedness:  Chest pain with exertion:None  Edema:None  Short of breath:None  Average home BPs: at goal 130/80 Other issues:   Elevated Cholesterol: Improved on ;last year but remains at very poor control of LDL and tri.  Pt on fish oil. She is not taking red yeast rice at all.  (she hates taking pills, no SE), refuses welchol or statin  Lab Results  Component Value Date   CHOL 319* 12/30/2013   HDL 77.10 12/30/2013   LDLCALC 215* 12/30/2013   LDLDIRECT 253.3 12/29/2012   TRIG 133.0 12/30/2013   CHOLHDL 4 12/30/2013  Diet compliance:Good.  Exercise: walking 3 times a week.  Other complaints: REFUSES STATINS.   Carotid stenosis: 05/2010: moderate/severe B  She continues to refuse to follow this.. She would not do CEA surgery even if indicated. She understands the risks and benefits and still refuses to follow or treat cholesterol aggressively.   Review of Systems  Constitutional: Negative for fever, fatigue and unexpected weight change.  HENT: Negative for ear pain, congestion, sore throat, sneezing, trouble swallowing and sinus pressure.  Eyes: Negative for pain and itching.  Respiratory: Negative for cough, shortness of breath and wheezing.  Cardiovascular: Negative for chest pain, palpitations and leg swelling.  Gastrointestinal: Negative for nausea, abdominal pain, diarrhea, constipation and blood in stool.  Genitourinary: Negative for dysuria, hematuria, vaginal discharge, difficulty urinating and menstrual problem.  Skin: Negative for rash.  Neurological: Negative for syncope, weakness, light-headedness, numbness and headaches.  Psychiatric/Behavioral: Negative for confusion and dysphoric mood. The patient is not nervous/anxious.  Objective:   Physical Exam  Constitutional: She is oriented to person, place, and time. Vital signs are normal. She appears well-developed and well-nourished. She is cooperative. Non-toxic appearance. She does not appear ill. No distress.  HENT:  Head: Normocephalic.  Right Ear:  Hearing, tympanic membrane, external ear and ear canal normal.  Left Ear: Hearing, tympanic membrane, external ear and ear canal normal.  Nose: Nose normal.  Eyes:  Conjunctivae, EOM and lids are normal. Pupils are equal, round, and reactive to light. No foreign bodies found.  Neck: Trachea normal. Carotid bruit IS present BILATERALLY greater on left than reight. No mass and no thyromegaly present.  Cardiovascular: Normal rate, regular rhythm, S1 normal, S2 normal, normal heart sounds and intact distal pulses. Exam reveals no gallop.  No murmur heard.  Pulmonary/Chest: Effort normal and breath sounds normal. No respiratory distress. She has no wheezes. She has no rhonchi. She has no rales.  Abdominal: Soft. Normal appearance and bowel sounds are normal. She exhibits no distension, no fluid wave, no abdominal bruit and no mass. There is no hepatosplenomegaly. There is no tenderness. There is no rebound, no guarding and no CVA tenderness. No hernia.  Lymphadenopathy:  She has no cervical adenopathy.  She has no axillary adenopathy.  Neurological: She is alert and oriented to person, place, and time. She has normal strength. No cranial nerve deficit or sensory deficit. She exhibits normal muscle tone.  Skin: Skin is warm, dry and intact. No rash noted.  Psychiatric: Her speech is normal and behavior is normal. Judgment normal. Her mood appears not anxious. Cognition and memory are normal. She does not exhibit a depressed mood.  Diabetic foot exam:  Normal inspection  No skin breakdown  No calluses  Normal DP pulses  Normal sensation to light touch and monofilament  Nails thickened and slightly yellow  Assessment & Plan:   AMW: The patient's preventative maintenance and recommended screening tests for an annual wellness exam were reviewed in full today.  Brought up to date unless services declined.  Counselled on the importance of diet, exercise, and its role in overall health and mortality.  The patient's FH and SH was reviewed, including their home life, tobacco status, and drug and alcohol status.   Vaccines:Uptodate with flu, PNA 23   and 13, Td, got shingles vaccine at [facility].  HYQ:MVHQION to continue evaluating this, she is taking calcium and getting weight bearing exercsie.  Mammogram:Not indicated.  DVE/pap: not indicated  Colon: stool cards neg 2011, no colonoscopy eval indicated.

## 2014-03-22 ENCOUNTER — Other Ambulatory Visit: Payer: Self-pay | Admitting: Family Medicine

## 2014-03-25 ENCOUNTER — Ambulatory Visit (INDEPENDENT_AMBULATORY_CARE_PROVIDER_SITE_OTHER): Payer: Medicare Other | Admitting: Family Medicine

## 2014-03-25 ENCOUNTER — Encounter: Payer: Self-pay | Admitting: Family Medicine

## 2014-03-25 VITALS — BP 122/80 | HR 100 | Temp 97.6°F | Ht 61.25 in | Wt 104.5 lb

## 2014-03-25 DIAGNOSIS — R140 Abdominal distension (gaseous): Secondary | ICD-10-CM | POA: Diagnosis not present

## 2014-03-25 DIAGNOSIS — R5383 Other fatigue: Secondary | ICD-10-CM

## 2014-03-25 DIAGNOSIS — Z79899 Other long term (current) drug therapy: Secondary | ICD-10-CM

## 2014-03-25 LAB — CBC WITH DIFFERENTIAL/PLATELET
BASOS ABS: 0 10*3/uL (ref 0.0–0.1)
Basophils Relative: 0.5 % (ref 0.0–3.0)
Eosinophils Absolute: 0.3 10*3/uL (ref 0.0–0.7)
Eosinophils Relative: 5 % (ref 0.0–5.0)
HEMATOCRIT: 46.7 % — AB (ref 36.0–46.0)
Hemoglobin: 15.3 g/dL — ABNORMAL HIGH (ref 12.0–15.0)
LYMPHS ABS: 1.7 10*3/uL (ref 0.7–4.0)
Lymphocytes Relative: 25.9 % (ref 12.0–46.0)
MCHC: 32.8 g/dL (ref 30.0–36.0)
MCV: 88 fl (ref 78.0–100.0)
MONOS PCT: 6.9 % (ref 3.0–12.0)
Monocytes Absolute: 0.5 10*3/uL (ref 0.1–1.0)
NEUTROS PCT: 61.7 % (ref 43.0–77.0)
Neutro Abs: 4.1 10*3/uL (ref 1.4–7.7)
PLATELETS: 251 10*3/uL (ref 150.0–400.0)
RBC: 5.3 Mil/uL — ABNORMAL HIGH (ref 3.87–5.11)
RDW: 13.8 % (ref 11.5–15.5)
WBC: 6.6 10*3/uL (ref 4.0–10.5)

## 2014-03-25 LAB — TSH: TSH: 2.61 u[IU]/mL (ref 0.35–4.50)

## 2014-03-25 LAB — COMPREHENSIVE METABOLIC PANEL
ALT: 18 U/L (ref 0–35)
AST: 26 U/L (ref 0–37)
Albumin: 4.2 g/dL (ref 3.5–5.2)
Alkaline Phosphatase: 66 U/L (ref 39–117)
BILIRUBIN TOTAL: 0.7 mg/dL (ref 0.2–1.2)
BUN: 23 mg/dL (ref 6–23)
CO2: 30 meq/L (ref 19–32)
CREATININE: 0.87 mg/dL (ref 0.40–1.20)
Calcium: 9.9 mg/dL (ref 8.4–10.5)
Chloride: 104 mEq/L (ref 96–112)
GFR: 66.03 mL/min (ref >60.00)
Glucose, Bld: 109 mg/dL — ABNORMAL HIGH (ref 70–99)
Potassium: 5.2 mEq/L — ABNORMAL HIGH (ref 3.5–5.1)
Sodium: 140 mEq/L (ref 135–145)
Total Protein: 7.4 g/dL (ref 6.0–8.3)

## 2014-03-25 LAB — VITAMIN D 25 HYDROXY (VIT D DEFICIENCY, FRACTURES): VITD: 42.37 ng/mL (ref 30.00–100.00)

## 2014-03-25 LAB — VITAMIN B12: VITAMIN B 12: 732 pg/mL (ref 211–911)

## 2014-03-25 NOTE — Assessment & Plan Note (Signed)
EKG  nml and stable. HAs more acute fatiuge as well as this episodic fatigue/weakness with exertion. Will eval with labs for anemia, thyroid etc. Can consider ECHO to eval heart function.

## 2014-03-25 NOTE — Progress Notes (Signed)
Pre visit review using our clinic review tool, if applicable. No additional management support is needed unless otherwise documented below in the visit note. 

## 2014-03-25 NOTE — Assessment & Plan Note (Signed)
No clear infectious symptoms. May be due to stress increase. Could try reglan for nausea and as a prokinetic. Probiotic in past has not worked for her.

## 2014-03-25 NOTE — Progress Notes (Signed)
Subjective:    Patient ID: Carla Campos, female    DOB: Oct 07, 1928, 81 y.o.   MRN: [id number]  HPI   81 year old female with well controlled DM, HTN presents feeling fatigued, ill and with upset stomach.  She states  She started feeling ill few months ago, lasted 2-3 days, slept a lot and resolved. Symptoms returned in last 5 days. Symptoms include uncomfortable feeling after eating, belching gas after eating. Discomfort not pain in epigastrum, associated with nausea. No body aches, no fever. No D, no C, no blood in stool.   She feels she is very tense lately. She has been more worried lately, increase in stress... Usually stomach gfeel worse with this.  Occ when she is walking and working in last few months... She feeels suddenly weak and has to sit. No CP, no SOB, no dizziness , no diaphoresis.  Rests and has water and it resolves after 10 min. Occurs 1-2 times a week, not worse or more frequent.  BP Readings from Last 3 Encounters:  03/25/14 122/80  01/05/14 140/84  07/02/13 150/80     Review of Systems  Constitutional: Positive for fatigue. Negative for fever.  HENT: Negative for ear pain.   Eyes: Negative for pain.  Respiratory: Negative for cough and shortness of breath.   Cardiovascular: Positive for leg swelling. Negative for chest pain and palpitations.       Had an episode x 1 of leg swelling, resolved with time.  Gastrointestinal: Negative for diarrhea, constipation and blood in stool.  Musculoskeletal: Positive for arthralgias.       Objective:   Physical Exam  Constitutional: Vital signs are normal. She appears well-developed and well-nourished. She is cooperative.  Non-toxic appearance. She does not appear ill. No distress.  HENT:  Head: Normocephalic.  Right Ear: Hearing, tympanic membrane, external ear and ear canal normal. Tympanic membrane is not erythematous, not retracted and not bulging.  Left Ear: Hearing, tympanic membrane, external ear and  ear canal normal. Tympanic membrane is not erythematous, not retracted and not bulging.  Nose: No mucosal edema or rhinorrhea. Right sinus exhibits no maxillary sinus tenderness and no frontal sinus tenderness. Left sinus exhibits no maxillary sinus tenderness and no frontal sinus tenderness.  Mouth/Throat: Uvula is midline, oropharynx is clear and moist and mucous membranes are normal.  Eyes: Conjunctivae, EOM and lids are normal. Pupils are equal, round, and reactive to light. Lids are everted and swept, no foreign bodies found.  Neck: Trachea normal and normal range of motion. Neck supple. Carotid bruit is not present. No thyroid mass and no thyromegaly present.  Cardiovascular: Normal rate, regular rhythm, S1 normal, S2 normal, normal heart sounds, intact distal pulses and normal pulses.  Exam reveals no gallop and no friction rub.   No murmur heard. Pulmonary/Chest: Effort normal and breath sounds normal. No tachypnea. No respiratory distress. She has no decreased breath sounds. She has no wheezes. She has no rhonchi. She has no rales.  Abdominal: Soft. Normal appearance and bowel sounds are normal. There is no hepatosplenomegaly. There is generalized tenderness. There is no rigidity, no rebound, no guarding and no CVA tenderness. No hernia.  Neurological: She is alert.  Skin: Skin is warm, dry and intact. No rash noted.  Psychiatric: Her speech is normal and behavior is normal. Judgment and thought content normal. Her mood appears not anxious. Cognition and memory are normal. She does not exhibit a depressed mood.  Assessment & Plan:

## 2014-03-25 NOTE — Patient Instructions (Addendum)
Stop at lab on way out. If labs are negative we can try reglan as needed with meals. Will also consider ECHO if fatigue spells continuing.

## 2014-04-06 ENCOUNTER — Encounter: Payer: Self-pay | Admitting: Family Medicine

## 2014-04-06 ENCOUNTER — Ambulatory Visit (INDEPENDENT_AMBULATORY_CARE_PROVIDER_SITE_OTHER): Payer: Medicare Other | Admitting: Family Medicine

## 2014-04-06 VITALS — BP 130/76 | HR 110 | Temp 98.3°F | Ht 61.25 in | Wt 104.8 lb

## 2014-04-06 DIAGNOSIS — F332 Major depressive disorder, recurrent severe without psychotic features: Secondary | ICD-10-CM

## 2014-04-06 DIAGNOSIS — F05 Delirium due to known physiological condition: Secondary | ICD-10-CM

## 2014-04-06 DIAGNOSIS — F325 Major depressive disorder, single episode, in full remission: Secondary | ICD-10-CM | POA: Insufficient documentation

## 2014-04-06 LAB — CBC WITH DIFFERENTIAL/PLATELET
Basophils Absolute: 0 10*3/uL (ref 0.0–0.1)
Basophils Relative: 0.6 % (ref 0.0–3.0)
Eosinophils Absolute: 0.2 10*3/uL (ref 0.0–0.7)
Eosinophils Relative: 2.1 % (ref 0.0–5.0)
HCT: 46.3 % — ABNORMAL HIGH (ref 36.0–46.0)
HEMOGLOBIN: 15.4 g/dL — AB (ref 12.0–15.0)
LYMPHS PCT: 20 % (ref 12.0–46.0)
Lymphs Abs: 1.6 10*3/uL (ref 0.7–4.0)
MCHC: 33.2 g/dL (ref 30.0–36.0)
MCV: 87.4 fl (ref 78.0–100.0)
MONOS PCT: 6.4 % (ref 3.0–12.0)
Monocytes Absolute: 0.5 10*3/uL (ref 0.1–1.0)
NEUTROS ABS: 5.8 10*3/uL (ref 1.4–7.7)
Neutrophils Relative %: 70.9 % (ref 43.0–77.0)
Platelets: 237 10*3/uL (ref 150.0–400.0)
RBC: 5.3 Mil/uL — ABNORMAL HIGH (ref 3.87–5.11)
RDW: 13.6 % (ref 11.5–15.5)
WBC: 8.2 10*3/uL (ref 4.0–10.5)

## 2014-04-06 LAB — COMPREHENSIVE METABOLIC PANEL
ALBUMIN: 4.2 g/dL (ref 3.5–5.2)
ALK PHOS: 74 U/L (ref 39–117)
ALT: 17 U/L (ref 0–35)
AST: 23 U/L (ref 0–37)
BILIRUBIN TOTAL: 0.5 mg/dL (ref 0.2–1.2)
BUN: 15 mg/dL (ref 6–23)
CO2: 30 meq/L (ref 19–32)
Calcium: 10 mg/dL (ref 8.4–10.5)
Chloride: 105 mEq/L (ref 96–112)
Creatinine, Ser: 0.76 mg/dL (ref 0.40–1.20)
GFR: 77.17 mL/min (ref >60.00)
GLUCOSE: 128 mg/dL — AB (ref 70–99)
Potassium: 4.1 mEq/L (ref 3.5–5.1)
Sodium: 140 mEq/L (ref 135–145)
TOTAL PROTEIN: 7.5 g/dL (ref 6.0–8.3)

## 2014-04-06 LAB — TSH: TSH: 1.73 u[IU]/mL (ref 0.35–4.50)

## 2014-04-06 NOTE — Progress Notes (Signed)
Subjective:    Patient ID: Carla Campos, female    DOB: 24-Oct-1928, 81 y.o.   MRN: [id number]  HPI  81 year old female with HTN, carotid stenosis, bilateral, DM presents with new onset memory loss in last 7 days.  She took an allergy pill (allerest( hlorpheniramine and psudoephedrine) on 3/30. 3/31, 4/1 On 4/1 AM felt suddenly disoriented, confused, resolved gradually over a few hours. Ex took her an hour to pay a bill.  5 days ago she had another episode where she could not remember to turn on faucet.  Since then she can remember date, name recall  but has episodes of inabilty to remember easy tasks.   Mild headache. No new weakness, no new numbness. No slurred speech,  Depth perception is off. Mild sore glands on 4/1, better now.  No dysuria, no frequency, no urgency. No recent falls. Drinking less lately, No N/V/ D.  She has been somewhat depressed at times. Husband states she has been more tearful in several months When husband left room.. She burst into tears. " I am horribly depressed.".    Recent eval for lightheadedness, nml Recent EKG nml.  Review of Systems  Constitutional: Positive for fatigue. Negative for fever.  HENT: Negative for ear pain and sinus pressure.   Eyes: Negative for pain.  Respiratory: Negative for cough, shortness of breath and wheezing.   Cardiovascular: Positive for palpitations. Negative for chest pain and leg swelling.  Gastrointestinal: Negative for constipation and blood in stool.      HR rate elevated Objective:   Physical Exam  Constitutional: She is oriented to person, place, and time. Vital signs are normal. She appears well-developed and well-nourished. She is cooperative.  Non-toxic appearance. She does not appear ill. No distress.  HENT:  Head: Normocephalic.  Right Ear: Hearing, tympanic membrane, external ear and ear canal normal. Tympanic membrane is not erythematous, not retracted and not bulging.  Left Ear: Hearing,  tympanic membrane, external ear and ear canal normal. Tympanic membrane is not erythematous, not retracted and not bulging.  Nose: No mucosal edema or rhinorrhea. Right sinus exhibits no maxillary sinus tenderness and no frontal sinus tenderness. Left sinus exhibits no maxillary sinus tenderness and no frontal sinus tenderness.  Mouth/Throat: Uvula is midline, oropharynx is clear and moist and mucous membranes are normal.  Eyes: Conjunctivae, EOM and lids are normal. Pupils are equal, round, and reactive to light. Lids are everted and swept, no foreign bodies found.  Neck: Trachea normal and normal range of motion. Neck supple. Carotid bruit is not present. No thyroid mass and no thyromegaly present.  Cardiovascular: Normal rate, regular rhythm, S1 normal, S2 normal, normal heart sounds, intact distal pulses and normal pulses.  Exam reveals no gallop and no friction rub.   No murmur heard. Pulmonary/Chest: Effort normal and breath sounds normal. No tachypnea. No respiratory distress. She has no decreased breath sounds. She has no wheezes. She has no rhonchi. She has no rales.  Abdominal: Soft. Normal appearance and bowel sounds are normal. There is no tenderness.  Neurological: She is alert and oriented to person, place, and time. She has normal strength and normal reflexes. No cranial nerve deficit or sensory deficit. She exhibits normal muscle tone. She displays a negative Romberg sign. Coordination and gait normal. GCS eye subscore is 4. GCS verbal subscore is 5. GCS motor subscore is 6.  Nml cerebellar exam   No papilledema  Skin: Skin is warm, dry and intact. No rash noted.  Psychiatric: Her speech is normal. Judgment and thought content normal. Her mood appears not anxious. Her affect is labile. She is agitated. Cognition and memory are normal. Cognition and memory are not impaired. She does not exhibit a depressed mood. She exhibits normal recent memory and normal remote memory.            Assessment & Plan:  Acute episodic confusion:  May be due to recent antihistamine use but has been off this with no improvement in symptoms. No urinary symptoms. Will eval with labs such as thyroid, electrolyte imbalance, liver, kidney issues, recent vit testing nml.  Very likely may be caused by severe ongoing untreated depression, Pt willing to start a medication to treat depression if  testing returns normal.  Nml neuro exam today  But pt with carotid steonosis and at risk for possible TIA and ischemic changes in brain. Can consider MRI to eval further especially given recent lightheadedness.

## 2014-04-06 NOTE — Progress Notes (Signed)
Pre visit review using our clinic review tool, if applicable. No additional management support is needed unless otherwise documented below in the visit note. 

## 2014-04-06 NOTE — Patient Instructions (Addendum)
Stop at lab on way out. Do not use any allergy medication for now. Push fluids. If labs are normal we can consider medication for depression and possibly imaging.

## 2014-04-07 ENCOUNTER — Telehealth: Payer: Self-pay

## 2014-04-07 DIAGNOSIS — F05 Delirium due to known physiological condition: Secondary | ICD-10-CM

## 2014-04-07 NOTE — Telephone Encounter (Signed)
Patient called and said she would like to schedule the MRI. [facility]

## 2014-04-08 MED ORDER — SERTRALINE HCL 25 MG PO TABS: 25.0000 mg | ORAL_TABLET | Freq: Every day | ORAL | Status: DC

## 2014-04-08 NOTE — Telephone Encounter (Signed)
Mr. Lucia Carla Campos (husband) notified as instructed by telephone.  She will come by office today or tomorrow to collect a urine sample.

## 2014-04-08 NOTE — Telephone Encounter (Signed)
Please let pt know  1. Sertraline sent in to pharm at low dose...take at bedtime. Expect to have some side effects that are mild ( feeling different etc.), will likely resolve after 1 week. If no side effects after 2 weeks, increase dose to 50 mg daily.  2. Sent in order for MRI.  3. Please also have her come in to drop off urine sample as she left last time without us obtaining it  Dx confusion.

## 2014-04-12 ENCOUNTER — Other Ambulatory Visit: Payer: Medicare Other

## 2014-04-12 ENCOUNTER — Other Ambulatory Visit (INDEPENDENT_AMBULATORY_CARE_PROVIDER_SITE_OTHER): Payer: Medicare Other | Admitting: Family Medicine

## 2014-04-12 DIAGNOSIS — F05 Delirium due to known physiological condition: Secondary | ICD-10-CM

## 2014-04-12 LAB — POCT URINALYSIS DIPSTICK
Bilirubin, UA: NEGATIVE
Blood, UA: NEGATIVE
Glucose, UA: NEGATIVE
Ketones, UA: NEGATIVE
LEUKOCYTES UA: NEGATIVE
Nitrite, UA: NEGATIVE
PH UA: 6
PROTEIN UA: NEGATIVE
Spec Grav, UA: 1.01
Urobilinogen, UA: 0.2

## 2014-04-16 ENCOUNTER — Ambulatory Visit (INDEPENDENT_AMBULATORY_CARE_PROVIDER_SITE_OTHER): Payer: Medicare Other | Admitting: Family Medicine

## 2014-04-16 ENCOUNTER — Ambulatory Visit: Admit: 2014-04-16 | Disposition: A | Discharge status: Home / Self Care | Payer: Self-pay | Admitting: Family Medicine

## 2014-04-16 ENCOUNTER — Telehealth: Payer: Self-pay | Admitting: Family Medicine

## 2014-04-16 DIAGNOSIS — I639 Cerebral infarction, unspecified: Secondary | ICD-10-CM

## 2014-04-16 NOTE — Telephone Encounter (Addendum)
Call from radiologist. Dr. Briant CedarMattingly.  MRI brain: Several subacute frontal and left parietal lobe.  Previous  Left frontal lobe .. Old.  Spoke with pt, she and husband will come to office to discuss results NOW.

## 2014-04-16 NOTE — Patient Instructions (Addendum)
Start  aspirin  at least 81 mg daily enteric coated.  We will call you to set up carotiid dopplers and ECHO of the heart.  Consider cholesterol treatment. Can consider appt with neurologist in future. If any further symptoms.. Go to ER immediately.

## 2014-04-16 NOTE — Progress Notes (Signed)
Pt asked to  to office to discuss results of  MRI brain. Showed multiple subacute infarcts. This corresponds with her symptoms  On 3/30, 3/31 and 4/1. Pt with history of carotid stenosis (had refused to follow this) as well as very poorly controlled chol with LDL in 20s (refused to treat with med other than omega 3s).  She has not had any further episodes since.  Recently started on sertraline for severe depression in last week, low dose. No SE.    Husband also reports she has sleep apnea.   BPs well controlled at home. BP Readings from Last 3 Encounters:  04/06/14 130/76  03/25/14 122/80  01/05/14 140/84   Plan: Start baby aspirin. Pt considering medication for cholesterol.  Refer to carotid dopplers and ECHO. Can also consider MRI angio to eval intracranial vessels better. Pt agreeable.

## 2014-04-18 ENCOUNTER — Encounter: Payer: Self-pay | Admitting: Family Medicine

## 2014-04-19 ENCOUNTER — Encounter: Payer: Self-pay | Admitting: Family Medicine

## 2014-04-20 ENCOUNTER — Other Ambulatory Visit: Payer: Self-pay

## 2014-04-20 ENCOUNTER — Ambulatory Visit (INDEPENDENT_AMBULATORY_CARE_PROVIDER_SITE_OTHER): Payer: Medicare Other

## 2014-04-20 DIAGNOSIS — I639 Cerebral infarction, unspecified: Secondary | ICD-10-CM

## 2014-04-20 DIAGNOSIS — I10 Essential (primary) hypertension: Secondary | ICD-10-CM

## 2014-04-23 ENCOUNTER — Telehealth: Payer: Self-pay | Admitting: Family Medicine

## 2014-04-23 ENCOUNTER — Encounter (INDEPENDENT_AMBULATORY_CARE_PROVIDER_SITE_OTHER): Payer: Medicare Other

## 2014-04-23 DIAGNOSIS — I6523 Occlusion and stenosis of bilateral carotid arteries: Secondary | ICD-10-CM | POA: Diagnosis not present

## 2014-04-23 DIAGNOSIS — I63239 Cerebral infarction due to unspecified occlusion or stenosis of unspecified carotid arteries: Secondary | ICD-10-CM | POA: Insufficient documentation

## 2014-04-23 DIAGNOSIS — I639 Cerebral infarction, unspecified: Secondary | ICD-10-CM

## 2014-04-23 DIAGNOSIS — IMO0002 Reserved for concepts with insufficient information to code with codable children: Secondary | ICD-10-CM

## 2014-04-23 NOTE — Telephone Encounter (Signed)
Call from imaging center.  left sided carotid dopplers has progressed to 80%, occlusion.  Will urgently refer to vascular MD in light of recent CVA.  For now she will continue on Aspirin.  Pt continues to refuse cholesterol med.

## 2014-04-27 ENCOUNTER — Ambulatory Visit (INDEPENDENT_AMBULATORY_CARE_PROVIDER_SITE_OTHER): Payer: Medicare Other | Admitting: Cardiovascular Disease

## 2014-04-27 ENCOUNTER — Encounter: Payer: Self-pay | Admitting: Cardiovascular Disease

## 2014-04-27 ENCOUNTER — Ambulatory Visit
Admit: 2014-04-27 | Disposition: A | Discharge status: Home / Self Care | Payer: Self-pay | Attending: Vascular Surgery | Admitting: Vascular Surgery

## 2014-04-27 VITALS — BP 142/80 | HR 89 | Ht 61.0 in | Wt 104.5 lb

## 2014-04-27 DIAGNOSIS — I10 Essential (primary) hypertension: Secondary | ICD-10-CM

## 2014-04-27 DIAGNOSIS — I6523 Occlusion and stenosis of bilateral carotid arteries: Secondary | ICD-10-CM

## 2014-04-27 DIAGNOSIS — E7800 Pure hypercholesterolemia, unspecified: Secondary | ICD-10-CM

## 2014-04-27 DIAGNOSIS — IMO0002 Reserved for concepts with insufficient information to code with codable children: Secondary | ICD-10-CM

## 2014-04-27 DIAGNOSIS — I639 Cerebral infarction, unspecified: Secondary | ICD-10-CM

## 2014-04-27 DIAGNOSIS — R5383 Other fatigue: Secondary | ICD-10-CM

## 2014-04-27 MED ORDER — METOPROLOL TARTRATE 25 MG PO TABS: 25.0000 mg | ORAL_TABLET | Freq: Two times a day (BID) | ORAL | Status: DC

## 2014-04-27 NOTE — Patient Instructions (Signed)
You are doing well.  Please start metoprolol one pill twice a day Take one the morning of the procedure  Please call us if you have new issues that need to be addressed before your next appt.

## 2014-04-27 NOTE — Assessment & Plan Note (Signed)
We have recommended that she start metoprolol tartrate 25 mg twice a day for heart rate control.  She does report having baseline tachycardia on a regular basis
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
## 2014-04-27 NOTE — Assessment & Plan Note (Signed)
Recent fatigue following stroke. Physical therapy likely not needed given no focal deficits

## 2014-04-27 NOTE — Assessment & Plan Note (Signed)
We mentioned many of the medications she can take for high cholesterol. She does not want any of these at this time

## 2014-04-27 NOTE — Assessment & Plan Note (Signed)
Given no recent symptoms concerning for anginal and active lifestyle at baseline, no stress test has been ordered. Acceptable risk for upcoming surgery. Certainly stent placement would likely place her at lower surgical risk than general surgery and carotid endarterectomy. She would be an acceptable candidate for either procedure. There are subtle nonspecific EKG changes. If she chose to, stress test could be ordered for her. She has declined at this time. She prefers to focus on the carotid surgery.

## 2014-04-29 ENCOUNTER — Other Ambulatory Visit: Payer: Self-pay | Admitting: Vascular Surgery

## 2014-04-30 NOTE — Progress Notes (Signed)
Notify pt that ECHo looks  Good, goo heart squeeze, only mild aortic valve changes from aging. Continue as planned with vascular eval.

## 2014-05-12 ENCOUNTER — Encounter
Admission: RE | Disposition: A | Discharge status: Home / Self Care | Payer: Medicare Other | Source: Ambulatory Visit | Attending: Vascular Surgery

## 2014-05-12 ENCOUNTER — Encounter: Payer: Self-pay | Admitting: *Deleted

## 2014-05-12 ENCOUNTER — Inpatient Hospital Stay
Admission: RE | Admit: 2014-05-12 | Discharge: 2014-05-13 | DRG: 036 | Disposition: A | Discharge status: Home / Self Care | Payer: Medicare Other | Source: Ambulatory Visit | Attending: Vascular Surgery | Admitting: Vascular Surgery

## 2014-05-12 DIAGNOSIS — Z800 Family history of malignant neoplasm of digestive organs: Secondary | ICD-10-CM | POA: Diagnosis not present

## 2014-05-12 DIAGNOSIS — Z79899 Other long term (current) drug therapy: Secondary | ICD-10-CM | POA: Diagnosis not present

## 2014-05-12 DIAGNOSIS — E785 Hyperlipidemia, unspecified: Secondary | ICD-10-CM | POA: Diagnosis present

## 2014-05-12 DIAGNOSIS — I6521 Occlusion and stenosis of right carotid artery: Secondary | ICD-10-CM | POA: Diagnosis present

## 2014-05-12 DIAGNOSIS — Z7982 Long term (current) use of aspirin: Secondary | ICD-10-CM | POA: Diagnosis not present

## 2014-05-12 DIAGNOSIS — I10 Essential (primary) hypertension: Secondary | ICD-10-CM | POA: Diagnosis present

## 2014-05-12 DIAGNOSIS — Z9049 Acquired absence of other specified parts of digestive tract: Secondary | ICD-10-CM | POA: Diagnosis present

## 2014-05-12 DIAGNOSIS — Z881 Allergy status to other antibiotic agents status: Secondary | ICD-10-CM | POA: Diagnosis not present

## 2014-05-12 DIAGNOSIS — I2510 Atherosclerotic heart disease of native coronary artery without angina pectoris: Secondary | ICD-10-CM | POA: Diagnosis present

## 2014-05-12 DIAGNOSIS — I69331 Monoplegia of upper limb following cerebral infarction affecting right dominant side: Secondary | ICD-10-CM

## 2014-05-12 DIAGNOSIS — I6522 Occlusion and stenosis of left carotid artery: Secondary | ICD-10-CM | POA: Diagnosis present

## 2014-05-12 HISTORY — PX: CAROTID ANGIOGRAM: SHX5504

## 2014-05-12 HISTORY — PX: PERIPHERAL VASCULAR CATHETERIZATION: SHX172C

## 2014-05-12 LAB — CREATININE, SERUM
Creatinine, Ser: 1 mg/dL (ref 0.44–1.00)
GFR calc Af Amer: 59 mL/min — ABNORMAL LOW (ref >60)
GFR calc non Af Amer: 51 mL/min — ABNORMAL LOW (ref >60)

## 2014-05-12 LAB — BUN: BUN: 27 mg/dL — ABNORMAL HIGH (ref 6–20)

## 2014-05-12 LAB — GLUCOSE, CAPILLARY: Glucose-Capillary: 97 mg/dL (ref 70–99)

## 2014-05-12 SURGERY — CAROTID PTA/STENT INTERVENTION
Anesthesia: Moderate Sedation

## 2014-05-12 MED ORDER — METOPROLOL TARTRATE 1 MG/ML IV SOLN: 2.0000 mg | INTRAVENOUS | Status: DC | PRN

## 2014-05-12 MED ORDER — MIDAZOLAM HCL 2 MG/2ML IJ SOLN
INTRAMUSCULAR | Status: DC | PRN
  Administered 2014-05-12: 3 mg via INTRAVENOUS

## 2014-05-12 MED ORDER — SODIUM CHLORIDE 0.9 % IV BOLUS (SEPSIS)
250.0000 mL | Freq: Once | INTRAVENOUS | Status: AC
  Administered 2014-05-12: 250 mL via INTRAVENOUS

## 2014-05-12 MED ORDER — ACETAMINOPHEN 325 MG RE SUPP: 325.0000 mg | RECTAL | Status: DC | PRN

## 2014-05-12 MED ORDER — IOHEXOL 300 MG/ML  SOLN
INTRAMUSCULAR | Status: DC | PRN
  Administered 2014-05-12: 60 mL via INTRA_ARTERIAL

## 2014-05-12 MED ORDER — CLINDAMYCIN PHOSPHATE 300 MG/50ML IV SOLN
INTRAVENOUS | Status: AC
  Administered 2014-05-12: 09:00:00
  Filled 2014-05-12: qty 50

## 2014-05-12 MED ORDER — LISINOPRIL 5 MG PO TABS
7.5000 mg | ORAL_TABLET | Freq: Every day | ORAL | Status: DC
  Administered 2014-05-12 – 2014-05-13 (×2): 7.5 mg via ORAL
  Filled 2014-05-12 (×2): qty 1

## 2014-05-12 MED ORDER — HYDROMORPHONE HCL 1 MG/ML IJ SOLN: 1.0000 mg | INTRAMUSCULAR | Status: DC | PRN

## 2014-05-12 MED ORDER — CEFAZOLIN SODIUM 1-5 GM-% IV SOLN
INTRAVENOUS | Status: AC
  Filled 2014-05-12: qty 50

## 2014-05-12 MED ORDER — ALUM & MAG HYDROXIDE-SIMETH 200-200-20 MG/5ML PO SUSP: 15.0000 mL | ORAL | Status: DC | PRN

## 2014-05-12 MED ORDER — SODIUM CHLORIDE 0.9 % IV SOLN: 500.0000 mL | Freq: Once | INTRAVENOUS | Status: AC | PRN

## 2014-05-12 MED ORDER — ATROPINE SULFATE 1 MG/ML IJ SOLN
INTRAMUSCULAR | Status: DC | PRN
  Administered 2014-05-12: .5 mg via INTRAVENOUS

## 2014-05-12 MED ORDER — PHENYLEPHRINE HCL 10 MG/ML IJ SOLN
INTRAMUSCULAR | Status: AC
  Filled 2014-05-12: qty 1

## 2014-05-12 MED ORDER — MIDAZOLAM HCL 5 MG/5ML IJ SOLN
INTRAMUSCULAR | Status: AC
  Filled 2014-05-12: qty 5

## 2014-05-12 MED ORDER — HEPARIN SODIUM (PORCINE) 1000 UNIT/ML IJ SOLN
INTRAMUSCULAR | Status: AC
  Filled 2014-05-12: qty 1

## 2014-05-12 MED ORDER — METOPROLOL TARTRATE 25 MG PO TABS
25.0000 mg | ORAL_TABLET | Freq: Two times a day (BID) | ORAL | Status: DC
  Administered 2014-05-13: 25 mg via ORAL
  Filled 2014-05-12: qty 1

## 2014-05-12 MED ORDER — LABETALOL HCL 5 MG/ML IV SOLN: 10.0000 mg | INTRAVENOUS | Status: DC | PRN

## 2014-05-12 MED ORDER — ACETAMINOPHEN 325 MG PO TABS: 325.0000 mg | ORAL_TABLET | ORAL | Status: DC | PRN

## 2014-05-12 MED ORDER — HEPARIN SODIUM (PORCINE) 1000 UNIT/ML IJ SOLN
INTRAMUSCULAR | Status: DC | PRN
  Administered 2014-05-12: 5000 [IU] via INTRAVENOUS

## 2014-05-12 MED ORDER — GUAIFENESIN-DM 100-10 MG/5ML PO SYRP: 15.0000 mL | ORAL_SOLUTION | ORAL | Status: DC | PRN

## 2014-05-12 MED ORDER — ONDANSETRON HCL 4 MG/2ML IJ SOLN: 4.0000 mg | INTRAMUSCULAR | Status: DC | PRN

## 2014-05-12 MED ORDER — SERTRALINE HCL 25 MG PO TABS
25.0000 mg | ORAL_TABLET | Freq: Every day | ORAL | Status: DC
  Administered 2014-05-12 – 2014-05-13 (×2): 25 mg via ORAL
  Filled 2014-05-12 (×2): qty 1

## 2014-05-12 MED ORDER — ATROPINE SULFATE 0.1 MG/ML IJ SOLN
INTRAMUSCULAR | Status: AC
  Filled 2014-05-12: qty 10

## 2014-05-12 MED ORDER — SODIUM CHLORIDE 0.9 % IJ SOLN
INTRAMUSCULAR | Status: AC
  Filled 2014-05-12: qty 15

## 2014-05-12 MED ORDER — HYDRALAZINE HCL 20 MG/ML IJ SOLN: 5.0000 mg | INTRAMUSCULAR | Status: DC | PRN

## 2014-05-12 MED ORDER — LIDOCAINE HCL (PF) 1 % IJ SOLN
INTRAMUSCULAR | Status: AC
  Filled 2014-05-12: qty 10

## 2014-05-12 MED ORDER — CLINDAMYCIN PHOSPHATE 300 MG/50ML IV SOLN
300.0000 mg | Freq: Three times a day (TID) | INTRAVENOUS | Status: AC
  Administered 2014-05-12: 300 mg via INTRAVENOUS
  Filled 2014-05-12 (×2): qty 50

## 2014-05-12 MED ORDER — SODIUM CHLORIDE 0.9 % IV SOLN
INTRAVENOUS | Status: DC
  Administered 2014-05-12 (×2): via INTRAVENOUS

## 2014-05-12 MED ORDER — PHENOL 1.4 % MT LIQD
1.0000 | OROMUCOSAL | Status: DC | PRN
  Filled 2014-05-12: qty 177

## 2014-05-12 MED ORDER — FENTANYL CITRATE (PF) 100 MCG/2ML IJ SOLN
INTRAMUSCULAR | Status: AC
  Filled 2014-05-12: qty 2

## 2014-05-12 MED ORDER — DEXTROSE 50 % IV SOLN
0.5000 | Freq: Once | INTRAVENOUS | Status: AC | PRN
  Filled 2014-05-12: qty 50

## 2014-05-12 MED ORDER — DOCUSATE SODIUM 100 MG PO CAPS
100.0000 mg | ORAL_CAPSULE | Freq: Every day | ORAL | Status: DC
  Administered 2014-05-13: 100 mg via ORAL
  Filled 2014-05-12: qty 1

## 2014-05-12 MED ORDER — HEPARIN (PORCINE) IN NACL 2-0.9 UNIT/ML-% IJ SOLN
INTRAMUSCULAR | Status: AC
  Filled 2014-05-12: qty 1000

## 2014-05-12 MED ORDER — POTASSIUM CHLORIDE CRYS ER 20 MEQ PO TBCR: 20.0000 meq | EXTENDED_RELEASE_TABLET | Freq: Every day | ORAL | Status: DC | PRN

## 2014-05-12 MED ORDER — PANTOPRAZOLE SODIUM 40 MG PO TBEC
40.0000 mg | DELAYED_RELEASE_TABLET | Freq: Every day | ORAL | Status: DC
  Administered 2014-05-12 – 2014-05-13 (×2): 40 mg via ORAL
  Filled 2014-05-12 (×2): qty 1

## 2014-05-12 MED ORDER — TRAMADOL HCL 50 MG PO TABS: 50.0000 mg | ORAL_TABLET | Freq: Four times a day (QID) | ORAL | Status: DC | PRN

## 2014-05-12 MED ORDER — ASPIRIN EC 81 MG PO TBEC
81.0000 mg | DELAYED_RELEASE_TABLET | Freq: Every day | ORAL | Status: DC
  Administered 2014-05-13: 81 mg via ORAL
  Filled 2014-05-12 (×2): qty 1

## 2014-05-12 MED ORDER — MAGNESIUM SULFATE 2 GM/50ML IV SOLN
2.0000 g | Freq: Every day | INTRAVENOUS | Status: DC | PRN
  Filled 2014-05-12: qty 50

## 2014-05-12 MED ORDER — FENTANYL CITRATE (PF) 100 MCG/2ML IJ SOLN
INTRAMUSCULAR | Status: DC | PRN
  Administered 2014-05-12: 100 ug via INTRAVENOUS

## 2014-05-12 MED ORDER — CLOPIDOGREL BISULFATE 75 MG PO TABS
75.0000 mg | ORAL_TABLET | Freq: Every day | ORAL | Status: DC
  Administered 2014-05-12 – 2014-05-13 (×2): 75 mg via ORAL
  Filled 2014-05-12 (×2): qty 1

## 2014-05-12 MED ORDER — ONDANSETRON HCL 4 MG/2ML IJ SOLN: 4.0000 mg | Freq: Four times a day (QID) | INTRAMUSCULAR | Status: DC | PRN

## 2014-05-12 SURGICAL SUPPLY — 21 items
BALLN VIATRAC 5X20X135 (BALLOONS) ×5
BALLOON VIATRAC 5X20X135 (BALLOONS) ×3 IMPLANT
CARTRIDGE ACTIVE CLOT (MISCELLANEOUS) ×5 IMPLANT
CATH BEACON 5 .035 100 SIM1 TP (CATHETERS) ×5 IMPLANT
CATH G 5FX100 (CATHETERS) ×5 IMPLANT
DEVICE EMBOSHIELD NAV6 4.0-7.0 (WIRE) ×5 IMPLANT
DEVICE PRESTO INFLATION (MISCELLANEOUS) ×5 IMPLANT
DEVICE STARCLOSE SE CLOSURE (Vascular Products) ×5 IMPLANT
DEVICE TORQUE (MISCELLANEOUS) ×5 IMPLANT
KIT CAROTID MANIFOLD (MISCELLANEOUS) ×5 IMPLANT
PACK ANGIOGRAPHY (CUSTOM PROCEDURE TRAY) ×5 IMPLANT
SET INTRO CAPELLA COAXIAL (SET/KITS/TRAYS/PACK) ×5 IMPLANT
SHEATH BRITE TIP 6FRX11 (SHEATH) ×5 IMPLANT
SHEATH SHUTTLE SELECT 6F (SHEATH) ×5 IMPLANT
STENT XACT CAR 9-7X40X136 (Permanent Stent) ×5 IMPLANT
SYR MEDRAD MARK V 150ML (SYRINGE) ×5 IMPLANT
TOWEL OR 17X26 4PK STRL BLUE (TOWEL DISPOSABLE) ×10 IMPLANT
TUBING CONTRAST HIGH PRESS 72 (TUBING) ×5 IMPLANT
WIRE AMPLATZ SSTIFF .035X260CM (WIRE) ×5 IMPLANT
WIRE G STARTER 3X180 (WIRE) ×5 IMPLANT
WIRE G VAS 035X260 STIFF (WIRE) ×5 IMPLANT

## 2014-05-12 NOTE — Progress Notes (Signed)
Dr Gilda CreaseSchnier at bedside, notified for patient's hypotension, BP has been running in the 80s at times.  MD states give her a bolus of 250ml to run over an hour.  I like her systolic to be in the 90s and low 100 at least.  MD gave order to to keep patient on bedrest tonight.  MD gave order to advance diet to regular to start tomorrow.

## 2014-05-12 NOTE — Progress Notes (Deleted)
Patient continues to be lethargic, arouse to voice but sleeps between care.  Denies pain.  Vital signs stable, except hypotensive requiring levophed gtt, attempted to hold but needed restart multiple times, currently at 2mcg/kg/min.  FSBS hyperglycemic, sensitive sliding scale insulin initiated.  Tube feeding held as NG NG x1 with improvement after declogging protocol.  Dr Sainani aware of hepatitis surface antigen order in SCM, states "okay to draw it with routine lab tomorrow."   Patient currently resting in no apparent distress.  See SCM for further details. 

## 2014-05-12 NOTE — OR Nursing (Signed)
Patient taken back to vascular lab prior to labs resulted

## 2014-05-12 NOTE — Progress Notes (Signed)
Admission weight

## 2014-05-12 NOTE — H&P (Signed)
[facility] History & Physical Update  The patient was interviewed and re-examined.  The patient's previous History and Physical has been reviewed and is unchanged.  There is no change in the plan of care.  Beaumont Austad, Latina CraverGregory G, MD  05/12/2014, 4:50 PM

## 2014-05-12 NOTE — Plan of Care (Signed)
Problem: Phase I Progression Outcomes Goal: Vascular site scale level 0 - I Vascular Site Scale Level 0: No bruising/bleeding/hematoma Level I (Mild): Bruising/Ecchymosis, minimal bleeding/ooozing, palpable hematoma < 3 cm Level II (Moderate): Bleeding not affecting hemodynamic parameters, pseudoaneurysm, palpable hematoma > 3 cm Level III (Severe) Bleeding which affects hemodynamic parameters or retroperitoneal hemorrhage  Outcome: Completed/Met Date Met:  05/12/14 Right femoral incision site level 1 with minimal bleeding Goal: Pain controlled with appropriate interventions Outcome: Completed/Met Date Met:  05/12/14 Denies pain Goal: Activity progression as ordered Outcome: Progressing Bed rest until tomorrow am

## 2014-05-12 NOTE — Progress Notes (Signed)
Patient alert and oriented, vital sign stable except moderately hypotensive, MD aware, order 1 time bolus of NS bolus over an hour given. Sinus rhythm on cardiac monitor.   Patient denies pain throughout shift.  Right fem groin site with minimal bleeding from admission from [facility] improved dressing c/d/i.  Pulses unchanged since admission.  Patient tolerated diet well, advanced to regular for tomorrow am.   Patient currently resting in no apparent distress.  See SCM for further details.

## 2014-05-13 LAB — CBC
HEMATOCRIT: 34.6 % — AB (ref 35.0–47.0)
HEMOGLOBIN: 11.6 g/dL — AB (ref 12.0–16.0)
MCH: 29.8 pg (ref 26.0–34.0)
MCHC: 33.4 g/dL (ref 32.0–36.0)
MCV: 89.3 fL (ref 80.0–100.0)
Platelets: 167 10*3/uL (ref 150–440)
RBC: 3.87 MIL/uL (ref 3.80–5.20)
RDW: 13.2 % (ref 11.5–14.5)
WBC: 7.5 10*3/uL (ref 3.6–11.0)

## 2014-05-13 LAB — BASIC METABOLIC PANEL
Anion gap: 4 — ABNORMAL LOW (ref 5–15)
BUN: 15 mg/dL (ref 6–20)
CALCIUM: 8 mg/dL — AB (ref 8.9–10.3)
CO2: 28 mmol/L (ref 22–32)
Chloride: 113 mmol/L — ABNORMAL HIGH (ref 101–111)
Creatinine, Ser: 0.66 mg/dL (ref 0.44–1.00)
GFR calc Af Amer: >60 mL/min (ref >60)
GFR calc non Af Amer: >60 mL/min (ref >60)
Glucose, Bld: 104 mg/dL — ABNORMAL HIGH (ref 65–99)
POTASSIUM: 4.3 mmol/L (ref 3.5–5.1)
SODIUM: 145 mmol/L (ref 135–145)

## 2014-05-13 NOTE — Progress Notes (Signed)
Dressing changed.

## 2014-05-13 NOTE — Progress Notes (Signed)
Patient ambulated around nursing station this am without complaint.  Pulses at baseline, right groin dressing without bleeding, dressing intact.   Patient tolerated diet well this am.  Currently resting in no apparent distress.  Plan for this charge sometime today per Dr Gilda CreaseSchnier.

## 2014-05-13 NOTE — Progress Notes (Signed)
Patient with discharge order, spoke to MD to confirm discharge.  IV removed, catheter intact.  Discharge medication list and instructions given to patient with no further questions.  Patient belonging returned to patient.

## 2014-05-14 ENCOUNTER — Encounter: Payer: Self-pay | Admitting: Vascular Surgery

## 2014-05-24 ENCOUNTER — Telehealth: Payer: Self-pay

## 2014-05-24 NOTE — Telephone Encounter (Signed)
Spoke w/ pt.  She reports that her metoprolol is causing her severe diarrhea.  She states that she is positive that this is the cause.  She took a dose on Saturday and has not taken any since.  Reports that diarrhea has resolved since stopping med.  She states that her HR & BP have been very good but she will continue to monitor.  She would like Dr. Mariah MillingGollan to be aware and see if he would recommend another med for her.  Please advise.  Thank you.

## 2014-05-24 NOTE — Telephone Encounter (Signed)
Pt states her Metoprolol is giving her diarrhea real bad, and needs a different medication. Pt asks if it is ok she stops it. Please call and advise.

## 2014-05-25 NOTE — Telephone Encounter (Signed)
Pt would like us to know that she had diarrhea again this morning Wants to let us know it has to be another medication for she did stop the metoprolol  She is not sure what to stop next please advise.  Or thinks it could be something else.  Please advise.

## 2014-05-27 NOTE — Telephone Encounter (Signed)
S/w pt who indicated she had more diarrhea today and feels metoprolol is not the cause. BP 119/80's today, HR 90s Indicated Dr. Mariah MillingGollan recommends to stop metoprolol but continue to monitor BP and HR at home and report if either is high. Pt verbalized understanding with no further questions.

## 2014-05-27 NOTE — Telephone Encounter (Signed)
Would stop the metoprolol Monitor blood pressure and heart rate at home Would recommend she call us if these run high

## 2014-06-22 ENCOUNTER — Encounter: Payer: Self-pay | Admitting: Family Medicine

## 2014-08-13 ENCOUNTER — Ambulatory Visit: Payer: Medicare Other | Admitting: Family Medicine

## 2014-09-23 ENCOUNTER — Other Ambulatory Visit: Payer: Self-pay | Admitting: Family Medicine

## 2014-10-18 ENCOUNTER — Ambulatory Visit (INDEPENDENT_AMBULATORY_CARE_PROVIDER_SITE_OTHER): Payer: Medicare Other | Admitting: Family Medicine

## 2014-10-18 ENCOUNTER — Encounter: Payer: Self-pay | Admitting: Family Medicine

## 2014-10-18 VITALS — BP 146/65 | HR 104 | Temp 98.4°F | Ht 61.0 in | Wt 104.5 lb

## 2014-10-18 DIAGNOSIS — A938 Other specified arthropod-borne viral fevers: Secondary | ICD-10-CM

## 2014-10-18 DIAGNOSIS — I639 Cerebral infarction, unspecified: Secondary | ICD-10-CM | POA: Diagnosis not present

## 2014-10-18 MED ORDER — DOXYCYCLINE HYCLATE 100 MG PO TABS: 100.0000 mg | ORAL_TABLET | Freq: Two times a day (BID) | ORAL | Status: DC
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
## 2014-10-18 NOTE — Progress Notes (Signed)
Pre visit review using our clinic review tool, if applicable. No additional management support is needed unless otherwise documented below in the visit note. 

## 2014-10-22 ENCOUNTER — Encounter: Payer: Self-pay | Admitting: Family Medicine

## 2014-10-22 ENCOUNTER — Ambulatory Visit (INDEPENDENT_AMBULATORY_CARE_PROVIDER_SITE_OTHER): Payer: Medicare Other | Admitting: Family Medicine

## 2014-10-22 ENCOUNTER — Other Ambulatory Visit: Payer: Self-pay | Admitting: Family Medicine

## 2014-10-22 VITALS — BP 161/76 | HR 96 | Temp 98.5°F | Ht 61.0 in | Wt 104.2 lb

## 2014-10-22 DIAGNOSIS — S30861A Insect bite (nonvenomous) of abdominal wall, initial encounter: Secondary | ICD-10-CM | POA: Diagnosis not present

## 2014-10-22 DIAGNOSIS — W57XXXA Bitten or stung by nonvenomous insect and other nonvenomous arthropods, initial encounter: Secondary | ICD-10-CM

## 2014-10-22 NOTE — Progress Notes (Signed)
Pre visit review using our clinic review tool, if applicable. No additional management support is needed unless otherwise documented below in the visit note. 

## 2014-10-22 NOTE — Progress Notes (Signed)
   Subjective:    Patient ID: Carla Campos, female    DOB: 1928-07-25, 81 y.o.   MRN: [id number]  HPI  81 year old female presents following tick bite 11 days ago. Saw tick.. Small black tick. She thinks it was likely on short time. She believes she got it from her dog. She had not been outside. Had redness around it, now fade to brownish. No other rash.    She took 1 days of old doxycyline She saw Dr. Patsy Lageropland. Given rx for doxycycline.  She day 5/10.    No HA, no joint pain , no fever. No other rash.      She requests lyme disease testing.  Review of Systems  Constitutional: Negative for fever and fatigue.  HENT: Negative for ear pain.   Eyes: Negative for pain.  Respiratory: Negative for chest tightness and shortness of breath.   Cardiovascular: Negative for chest pain, palpitations and leg swelling.  Gastrointestinal: Negative for abdominal pain.  Genitourinary: Negative for dysuria.       Objective:   Physical Exam  Constitutional: Vital signs are normal. She appears well-developed and well-nourished. She is cooperative.  Non-toxic appearance. She does not appear ill. No distress.  Elderly female in NAD  HENT:  Head: Normocephalic.  Right Ear: Hearing, tympanic membrane, external ear and ear canal normal. Tympanic membrane is not erythematous, not retracted and not bulging.  Left Ear: Hearing, tympanic membrane, external ear and ear canal normal. Tympanic membrane is not erythematous, not retracted and not bulging.  Nose: No mucosal edema or rhinorrhea. Right sinus exhibits no maxillary sinus tenderness and no frontal sinus tenderness. Left sinus exhibits no maxillary sinus tenderness and no frontal sinus tenderness.  Mouth/Throat: Uvula is midline, oropharynx is clear and moist and mucous membranes are normal.  Eyes: Conjunctivae, EOM and lids are normal. Pupils are equal, round, and reactive to light. Lids are everted and swept, no foreign bodies found.  Neck:  Trachea normal and normal range of motion. Neck supple. Carotid bruit is not present. No thyroid mass and no thyromegaly present.  Cardiovascular: Normal rate, regular rhythm, S1 normal, S2 normal, normal heart sounds, intact distal pulses and normal pulses.  Exam reveals no gallop and no friction rub.   No murmur heard. Pulmonary/Chest: Effort normal and breath sounds normal. No tachypnea. No respiratory distress. She has no decreased breath sounds. She has no wheezes. She has no rhonchi. She has no rales.  Abdominal: Soft. Normal appearance and bowel sounds are normal. There is no tenderness.  Neurological: She is alert.  Skin: Skin is warm, dry and intact. No rash noted.  Healing hyperpigmentation on right flank at tick bite site  Psychiatric: Her speech is normal and behavior is normal. Judgment and thought content normal. Her mood appears not anxious. Cognition and memory are normal. She does not exhibit a depressed mood.          Assessment & Plan:

## 2014-10-22 NOTE — Patient Instructions (Addendum)
Complete antibiotics.  Stop at the lab on way out for lyme disease test.  return in 2 weeks for repeat test for lyme disease.

## 2014-10-22 NOTE — Assessment & Plan Note (Signed)
Pt on doxycycline already. She wishes to have testing to see if positive for lyme. Check today for IGM and repeat in 2 weeks for IgG eval.

## 2014-10-25 LAB — LYME AB/WESTERN BLOT REFLEX: B BURGDORFERI AB IGG+ IGM: 0.11 {ISR}

## 2014-10-27 LAB — HM DIABETES EYE EXAM

## 2014-11-05 ENCOUNTER — Other Ambulatory Visit (INDEPENDENT_AMBULATORY_CARE_PROVIDER_SITE_OTHER): Payer: Medicare Other

## 2014-11-05 ENCOUNTER — Other Ambulatory Visit: Payer: Medicare Other

## 2014-11-05 ENCOUNTER — Other Ambulatory Visit: Payer: Self-pay | Admitting: Family Medicine

## 2014-11-05 DIAGNOSIS — W57XXXA Bitten or stung by nonvenomous insect and other nonvenomous arthropods, initial encounter: Secondary | ICD-10-CM | POA: Diagnosis not present

## 2014-11-05 DIAGNOSIS — T148 Other injury of unspecified body region: Secondary | ICD-10-CM

## 2014-11-08 LAB — LYME AB/WESTERN BLOT REFLEX: B burgdorferi Ab IgG+IgM: 0.22 {ISR}

## 2014-11-10 ENCOUNTER — Ambulatory Visit: Payer: Medicare Other | Admitting: Cardiovascular Disease

## 2015-01-06 ENCOUNTER — Telehealth: Payer: Self-pay | Admitting: Family Medicine

## 2015-01-06 DIAGNOSIS — E119 Type 2 diabetes mellitus without complications: Secondary | ICD-10-CM

## 2015-01-06 DIAGNOSIS — E7800 Pure hypercholesterolemia, unspecified: Secondary | ICD-10-CM

## 2015-01-06 NOTE — Telephone Encounter (Signed)
-----   Message from Baldomero LamyNatasha C Chavers sent at 12/29/2014  3:21 PM EST ----- Regarding: cpx labs Fri 1/6, need orders. Thanks :-) Please order  future cpx labs for pt's upcoming lab appt. Thanks Rodney Boozeasha

## 2015-01-07 ENCOUNTER — Other Ambulatory Visit (INDEPENDENT_AMBULATORY_CARE_PROVIDER_SITE_OTHER): Payer: Medicare Other

## 2015-01-07 DIAGNOSIS — E119 Type 2 diabetes mellitus without complications: Secondary | ICD-10-CM | POA: Diagnosis not present

## 2015-01-07 DIAGNOSIS — E7800 Pure hypercholesterolemia, unspecified: Secondary | ICD-10-CM | POA: Diagnosis not present

## 2015-01-07 LAB — COMPREHENSIVE METABOLIC PANEL
ALT: 18 U/L (ref 0–35)
AST: 26 U/L (ref 0–37)
Albumin: 4 g/dL (ref 3.5–5.2)
Alkaline Phosphatase: 70 U/L (ref 39–117)
BUN: 21 mg/dL (ref 6–23)
CALCIUM: 9.6 mg/dL (ref 8.4–10.5)
CHLORIDE: 103 meq/L (ref 96–112)
CO2: 31 meq/L (ref 19–32)
Creatinine, Ser: 0.8 mg/dL (ref 0.40–1.20)
GFR: 72.6 mL/min (ref >60.00)
Glucose, Bld: 118 mg/dL — ABNORMAL HIGH (ref 70–99)
POTASSIUM: 4.5 meq/L (ref 3.5–5.1)
Sodium: 141 mEq/L (ref 135–145)
Total Bilirubin: 0.4 mg/dL (ref 0.2–1.2)
Total Protein: 7 g/dL (ref 6.0–8.3)

## 2015-01-07 LAB — LIPID PANEL
Cholesterol: 311 mg/dL — ABNORMAL HIGH (ref 0–200)
HDL: 81.3 mg/dL (ref >39.00)
LDL CALC: 198 mg/dL — AB (ref 0–99)
NonHDL: 229.66
Total CHOL/HDL Ratio: 4
Triglycerides: 160 mg/dL — ABNORMAL HIGH (ref 0.0–149.0)
VLDL: 32 mg/dL (ref 0.0–40.0)

## 2015-01-07 LAB — HEMOGLOBIN A1C: Hgb A1c MFr Bld: 6 % (ref 4.6–6.5)

## 2015-01-13 ENCOUNTER — Ambulatory Visit (INDEPENDENT_AMBULATORY_CARE_PROVIDER_SITE_OTHER): Payer: Medicare Other | Admitting: Family Medicine

## 2015-01-13 ENCOUNTER — Encounter: Payer: Self-pay | Admitting: Family Medicine

## 2015-01-13 VITALS — BP 166/76 | HR 89 | Temp 98.2°F | Ht 61.5 in | Wt 107.2 lb

## 2015-01-13 DIAGNOSIS — Z7189 Other specified counseling: Secondary | ICD-10-CM

## 2015-01-13 DIAGNOSIS — E7800 Pure hypercholesterolemia, unspecified: Secondary | ICD-10-CM

## 2015-01-13 DIAGNOSIS — E119 Type 2 diabetes mellitus without complications: Secondary | ICD-10-CM | POA: Diagnosis not present

## 2015-01-13 DIAGNOSIS — Z0000 Encounter for general adult medical examination without abnormal findings: Secondary | ICD-10-CM | POA: Diagnosis not present

## 2015-01-13 DIAGNOSIS — I639 Cerebral infarction, unspecified: Secondary | ICD-10-CM

## 2015-01-13 DIAGNOSIS — I10 Essential (primary) hypertension: Secondary | ICD-10-CM | POA: Diagnosis not present

## 2015-01-13 DIAGNOSIS — F3342 Major depressive disorder, recurrent, in full remission: Secondary | ICD-10-CM

## 2015-01-13 DIAGNOSIS — IMO0002 Reserved for concepts with insufficient information to code with codable children: Secondary | ICD-10-CM

## 2015-01-13 LAB — HM DIABETES FOOT EXAM

## 2015-01-13 MED ORDER — LISINOPRIL 10 MG PO TABS: 10.0000 mg | ORAL_TABLET | Freq: Every day | ORAL | Status: DC

## 2015-01-13 MED ORDER — DOXYCYCLINE HYCLATE 100 MG PO TABS: 100.0000 mg | ORAL_TABLET | Freq: Two times a day (BID) | ORAL | Status: DC

## 2015-01-13 MED ORDER — HYDROCODONE-HOMATROPINE 5-1.5 MG/5ML PO SYRP: 5.0000 mL | ORAL_SOLUTION | Freq: Every evening | ORAL | Status: DC | PRN

## 2015-01-13 NOTE — Progress Notes (Signed)
.I have personally reviewed the Medicare Annual Wellness questionnaire and have noted 1. The patient's medical and social history 2. Their use of alcohol, tobacco or illicit drugs 3. Their current medications and supplements 4. The patient's functional ability including ADL's, fall risks, home safety risks and hearing or visual             impairment. 5. Diet and physical activities 6. Evidence for depression or mood disorders 7.         Updated provider list Cognitive evaluation was performed and recorded on pt medicare questionnaire form. The patients weight, height, BMI and visual acuity have been recorded in the chart  I have made referrals, counseling and provided education to the patient based review of the above and I have provided the pt with a written personalized care plan for preventive services.   Doing well overall.  She has had 4 days, gradually worsening  new onset cough, productive green phelgm,fatigue Nasal congestion. No ear or face pain.  No SOB, occ hoarseness. She has been using hydrocodone cough syrup.. This helps her sleep at night.   She has been avoiding gluten and lactose. Gi issues are improved. No diarrhea and bloating.  No recent falls, none in last year.  Diabetes: Well controlled on no med.  Lab Results  Component Value Date   HGBA1C 6.0 01/07/2015  hypoglycemic episodes:?  Hyperglycemic episodes:?  Feet problems:None  Blood Sugars averaging: Not checking  eye exam within last year: 09/2013  Hypertension: Poor control on lisinopril 7.5 mg daily . Has white coat HTN element. BP Readings from Last 3 Encounters:  01/13/15 166/76  10/22/14 161/76  10/18/14 146/65  Using medication without problems or lightheadedness:  Chest pain with exertion:None  Edema:None  Short of breath:None  Average home BPs: at goal 162/70 Other issues:    Wt Readings from Last 3 Encounters:  01/13/15 107 lb 4 oz (48.648 kg)  10/22/14 104 lb 4 oz (47.287 kg)   10/18/14 104 lb 8 oz (47.401 kg)  Body mass index is 19.94 kg/(m^2).  She is already doing low salt diet.  Elevated Cholesterol: Improved but remains at very poor control of LDL and tri. Pt on fish oil. She is not taking red yeast rice at all. (she hates taking pills, no SE), refuses welchol or statin   GOAL < 70 given past CVA. Lab Results  Component Value Date   CHOL 311* 01/07/2015   HDL 81.30 01/07/2015   LDLCALC 198* 01/07/2015   LDLDIRECT 253.3 12/29/2012   TRIG 160.0* 01/07/2015   CHOLHDL 4 01/07/2015   Diet compliance:Good.  Exercise: walking 3 times a week.  Other complaints: REFUSES STATINS.   Carotid stenosis: followed by Dr. Lorretta HarpSchneir. Vascular surgeon following every 6 months. HAs stent on left.  Review of Systems  Constitutional: Negative for fever, fatigue and unexpected weight change.  HENT: Negative for ear pain, congestion, sore throat, sneezing, trouble swallowing and sinus pressure.  Eyes: Negative for pain and itching.  Respiratory: Negative for cough, shortness of breath and wheezing.  Cardiovascular: Negative for chest pain, palpitations and leg swelling.  Gastrointestinal: Negative for nausea, abdominal pain, diarrhea, constipation and blood in stool.  Genitourinary: Negative for dysuria, hematuria, vaginal discharge, difficulty urinating and menstrual problem.  Skin: Negative for rash.  Neurological: Negative for syncope, weakness, light-headedness, numbness and headaches.  Psychiatric/Behavioral: Negative for confusion and dysphoric mood. The patient is not nervous/anxious.  Objective:   Physical Exam  Constitutional: She is oriented to person, place,  and time. Vital signs are normal. She appears well-developed and well-nourished. She is cooperative. Non-toxic appearance. She does not appear ill. No distress.  HENT:  Head: Normocephalic.  Right Ear: Hearing, tympanic membrane, external ear and ear canal normal.  Left Ear:  Hearing, tympanic membrane, external ear and ear canal normal.  Nose: Nose normal.  Eyes: Conjunctivae, EOM and lids are normal. Pupils are equal, round, and reactive to light. No foreign bodies found.  Neck: Trachea normal. Carotid bruit IS present BILATERALLY greater on left than reight. No mass and no thyromegaly present.  Cardiovascular: Normal rate, regular rhythm, S1 normal, S2 normal, normal heart sounds and intact distal pulses. Exam reveals no gallop.  No murmur heard.  Pulmonary/Chest: Effort normal and breath sounds normal. No respiratory distress. She has no wheezes. She has no rhonchi. She has no rales.  Abdominal: Soft. Normal appearance and bowel sounds are normal. She exhibits no distension, no fluid wave, no abdominal bruit and no mass. There is no hepatosplenomegaly. There is no tenderness. There is no rebound, no guarding and no CVA tenderness. No hernia.  Lymphadenopathy:  She has no cervical adenopathy.  She has no axillary adenopathy.  Neurological: She is alert and oriented to person, place, and time. She has normal strength. No cranial nerve deficit or sensory deficit. She exhibits normal muscle tone.  Skin: Skin is warm, dry and intact. No rash noted.  Psychiatric: Her speech is normal and behavior is normal. Judgment normal. Her mood appears not anxious. Cognition and memory are normal. She does not exhibit a depressed mood.  Diabetic foot exam:  Normal inspection  No skin breakdown  No calluses  Normal DP pulses  Normal sensation to light touch and monofilament  Nails thickened and slightly yellow  Assessment & Plan:   AMW: The patient's preventative maintenance and recommended screening tests for an annual wellness exam were reviewed in full today.  Brought up to date unless services declined.  Counselled on the importance of diet, exercise, and its role in overall health and mortality.  The patient's FH and SH was reviewed, including their  home life, tobacco status, and drug and alcohol status.   Vaccines:Uptodate with flu, PNA 23 and 13, Td, shingles vaccine .  ZOX:WRUEAVW to continue evaluating this, she is taking calcium and getting weight bearing exercsie.  Mammogram:Not indicated.  DVE/pap: not indicated  Colon: stool cards neg 2011, no colonoscopy eval indicated. Non smoker

## 2015-01-13 NOTE — Assessment & Plan Note (Signed)
Working on living will, HCPOA: husband then son (reviewed 2016), full code

## 2015-01-13 NOTE — Patient Instructions (Addendum)
Increase lisinopril to 10 mg daily. Mucinex DM for cough during the day. Can use cough supressant at night as needed. If not improving in the next 7-10 days, new fever.. Start antibitoics.

## 2015-01-13 NOTE — Assessment & Plan Note (Signed)
Stable control at this time. 

## 2015-01-13 NOTE — Assessment & Plan Note (Signed)
Poor control on low dose lisinopril.  Increase to 10 mg daily.  This will also simplify regimen.

## 2015-01-13 NOTE — Progress Notes (Signed)
Pre visit review using our clinic review tool, if applicable. No additional management support is needed unless otherwise documented below in the visit note. 

## 2015-01-13 NOTE — Assessment & Plan Note (Signed)
Followed by Dr. Lorretta HarpSchneir vascular with dopplers next in 03/2015

## 2015-01-13 NOTE — Assessment & Plan Note (Signed)
Excellent control with diet  

## 2015-01-13 NOTE — Assessment & Plan Note (Signed)
Poor control despite diet. Pt goal LDL < 70, continues to refuse chol med of any type despite stenosis and past CVA.

## 2015-02-01 ENCOUNTER — Other Ambulatory Visit: Payer: Self-pay | Admitting: Family Medicine

## 2015-02-01 NOTE — Telephone Encounter (Signed)
Patient states she took the original prescription and started feeling some better but now her symptoms have returned and she quested a refill on the doxycycline thinking if she could take another course of antibiotics, it would knock it out. Advised we do not normally refill antibiotics and that she would need to be reevaluated before antibiotics could be prescribed.  She states she will give it a few more days and if symptoms don't get any better, she will call and schedule office visit.

## 2015-02-01 NOTE — Telephone Encounter (Signed)
What is this for? Pt needs to be seen prior to treatment with antibiotics.

## 2015-02-01 NOTE — Telephone Encounter (Signed)
Last office visit 01/13/2015. Refill??

## 2015-04-04 ENCOUNTER — Ambulatory Visit (INDEPENDENT_AMBULATORY_CARE_PROVIDER_SITE_OTHER)
Admission: RE | Admit: 2015-04-04 | Discharge: 2015-04-04 | Disposition: A | Discharge status: Home / Self Care | Payer: Medicare Other | Source: Ambulatory Visit | Attending: Family Medicine | Admitting: Family Medicine

## 2015-04-04 ENCOUNTER — Encounter: Payer: Self-pay | Admitting: Family Medicine

## 2015-04-04 ENCOUNTER — Ambulatory Visit (INDEPENDENT_AMBULATORY_CARE_PROVIDER_SITE_OTHER): Payer: Medicare Other | Admitting: Family Medicine

## 2015-04-04 VITALS — BP 144/88 | HR 91 | Temp 98.6°F | Ht 61.5 in | Wt 107.0 lb

## 2015-04-04 DIAGNOSIS — R1084 Generalized abdominal pain: Secondary | ICD-10-CM | POA: Diagnosis not present

## 2015-04-04 DIAGNOSIS — I77811 Abdominal aortic ectasia: Secondary | ICD-10-CM | POA: Diagnosis not present

## 2015-04-04 DIAGNOSIS — R1030 Lower abdominal pain, unspecified: Secondary | ICD-10-CM

## 2015-04-04 DIAGNOSIS — B349 Viral infection, unspecified: Secondary | ICD-10-CM

## 2015-04-04 DIAGNOSIS — I639 Cerebral infarction, unspecified: Secondary | ICD-10-CM

## 2015-04-04 LAB — POC URINALSYSI DIPSTICK (AUTOMATED)
Bilirubin, UA: NEGATIVE
Blood, UA: NEGATIVE
Glucose, UA: NEGATIVE
Ketones, UA: NEGATIVE
LEUKOCYTES UA: NEGATIVE
NITRITE UA: NEGATIVE
PH UA: 6
PROTEIN UA: NEGATIVE
Spec Grav, UA: 1.025
UROBILINOGEN UA: 0.2

## 2015-04-04 LAB — CBC WITH DIFFERENTIAL/PLATELET
BASOS ABS: 0 10*3/uL (ref 0.0–0.1)
Basophils Relative: 0.4 % (ref 0.0–3.0)
EOS PCT: 1.7 % (ref 0.0–5.0)
Eosinophils Absolute: 0.1 10*3/uL (ref 0.0–0.7)
HEMATOCRIT: 44.6 % (ref 36.0–46.0)
Hemoglobin: 14.8 g/dL (ref 12.0–15.0)
LYMPHS ABS: 2.4 10*3/uL (ref 0.7–4.0)
LYMPHS PCT: 28.8 % (ref 12.0–46.0)
MCHC: 33.3 g/dL (ref 30.0–36.0)
MCV: 87.5 fl (ref 78.0–100.0)
MONOS PCT: 7 % (ref 3.0–12.0)
Monocytes Absolute: 0.6 10*3/uL (ref 0.1–1.0)
NEUTROS PCT: 62.1 % (ref 43.0–77.0)
Neutro Abs: 5.1 10*3/uL (ref 1.4–7.7)
Platelets: 226 10*3/uL (ref 150.0–400.0)
RBC: 5.09 Mil/uL (ref 3.87–5.11)
RDW: 13.3 % (ref 11.5–15.5)
WBC: 8.2 10*3/uL (ref 4.0–10.5)

## 2015-04-04 LAB — BASIC METABOLIC PANEL
BUN: 26 mg/dL — ABNORMAL HIGH (ref 6–23)
CHLORIDE: 107 meq/L (ref 96–112)
CO2: 29 meq/L (ref 19–32)
CREATININE: 0.84 mg/dL (ref 0.40–1.20)
Calcium: 9.6 mg/dL (ref 8.4–10.5)
GFR: 68.59 mL/min (ref >60.00)
GLUCOSE: 112 mg/dL — AB (ref 70–99)
Potassium: 4.7 mEq/L (ref 3.5–5.1)
Sodium: 142 mEq/L (ref 135–145)

## 2015-04-04 LAB — HEPATIC FUNCTION PANEL
ALBUMIN: 4.2 g/dL (ref 3.5–5.2)
ALT: 22 U/L (ref 0–35)
AST: 27 U/L (ref 0–37)
Alkaline Phosphatase: 56 U/L (ref 39–117)
BILIRUBIN DIRECT: 0.1 mg/dL (ref 0.0–0.3)
TOTAL PROTEIN: 7.2 g/dL (ref 6.0–8.3)
Total Bilirubin: 0.5 mg/dL (ref 0.2–1.2)

## 2015-04-04 MED ORDER — IOPAMIDOL (ISOVUE-300) INJECTION 61%
100.0000 mL | Freq: Once | INTRAVENOUS | Status: AC | PRN
  Administered 2015-04-04: 100 mL via INTRAVENOUS

## 2015-04-04 NOTE — Progress Notes (Signed)
Pre visit review using our clinic review tool, if applicable. No additional management support is needed unless otherwise documented below in the visit note. 

## 2015-04-04 NOTE — Patient Instructions (Signed)
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
## 2015-04-05 DIAGNOSIS — I77811 Abdominal aortic ectasia: Secondary | ICD-10-CM | POA: Insufficient documentation

## 2015-04-29 ENCOUNTER — Ambulatory Visit (INDEPENDENT_AMBULATORY_CARE_PROVIDER_SITE_OTHER): Payer: Medicare Other | Admitting: Family Medicine

## 2015-04-29 ENCOUNTER — Encounter: Payer: Self-pay | Admitting: Family Medicine

## 2015-04-29 VITALS — BP 185/90 | HR 89 | Temp 98.6°F | Ht 61.5 in | Wt 108.5 lb

## 2015-04-29 DIAGNOSIS — I639 Cerebral infarction, unspecified: Secondary | ICD-10-CM | POA: Diagnosis not present

## 2015-04-29 DIAGNOSIS — M25512 Pain in left shoulder: Secondary | ICD-10-CM

## 2015-04-29 DIAGNOSIS — I10 Essential (primary) hypertension: Secondary | ICD-10-CM | POA: Diagnosis not present

## 2015-04-29 MED ORDER — METOPROLOL TARTRATE 25 MG PO TABS: 25.0000 mg | ORAL_TABLET | Freq: Two times a day (BID) | ORAL | Status: DC

## 2015-04-29 MED ORDER — DICLOFENAC SODIUM 75 MG PO TBEC: 75.0000 mg | DELAYED_RELEASE_TABLET | Freq: Two times a day (BID) | ORAL | Status: DC
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
## 2015-04-29 NOTE — Patient Instructions (Addendum)
Continue lisinopril daily.  Add metoprolol 25 mg twice daily.  Follow blood pressure and pulse.. Call if not at goal. Home physical therapy for shoulder pain. Start diclofenac twice daily  For shoulder pain.  If not improving schedule appt with Dr. Patsy Lageropland for likely shoulder injection.

## 2015-04-29 NOTE — Progress Notes (Signed)
Pre visit review using our clinic review tool, if applicable. No additional management support is needed unless otherwise documented below in the visit note. 

## 2015-04-29 NOTE — Assessment & Plan Note (Signed)
INadequate control. Restart metoprolol 25 mg BID. Conitnue ACEI.  Encouraged exercise, weight maintanence, healthy eating habits, low salt diet.

## 2015-04-29 NOTE — Assessment & Plan Note (Signed)
Likely rotator cuff tendonitis. Start home PT, info given. Trial of anti-inflammatory. If not improving in 2 weeks.. Scheduled appointment with sports med for injection.

## 2015-05-31 ENCOUNTER — Telehealth: Payer: Self-pay

## 2015-05-31 NOTE — Telephone Encounter (Signed)
Pt left v/m requesting 90 day supply of metoprol to [facility] [address]; spoke with [facility] at [facility] and pt has available refills and will get # [phone] ready for pick up; cost to pt $20.00 . Pt was not at home and gave info to Mr [name] [facility](DPR signed). He voiced understanding.

## 2015-06-20 ENCOUNTER — Ambulatory Visit (INDEPENDENT_AMBULATORY_CARE_PROVIDER_SITE_OTHER)
Admission: RE | Admit: 2015-06-20 | Discharge: 2015-06-20 | Disposition: A | Discharge status: Home / Self Care | Payer: Medicare Other | Source: Ambulatory Visit | Attending: Family Medicine | Admitting: Family Medicine

## 2015-06-20 ENCOUNTER — Ambulatory Visit (INDEPENDENT_AMBULATORY_CARE_PROVIDER_SITE_OTHER): Payer: Medicare Other | Admitting: Family Medicine

## 2015-06-20 ENCOUNTER — Encounter: Payer: Self-pay | Admitting: Family Medicine

## 2015-06-20 VITALS — BP 142/78 | HR 72 | Temp 98.3°F | Ht 61.5 in | Wt 108.5 lb

## 2015-06-20 DIAGNOSIS — S9001XA Contusion of right ankle, initial encounter: Secondary | ICD-10-CM | POA: Diagnosis not present

## 2015-06-20 DIAGNOSIS — I639 Cerebral infarction, unspecified: Secondary | ICD-10-CM

## 2015-06-20 DIAGNOSIS — M25571 Pain in right ankle and joints of right foot: Secondary | ICD-10-CM
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
## 2015-06-20 NOTE — Progress Notes (Signed)
Pre visit review using our clinic review tool, if applicable. No additional management support is needed unless otherwise documented below in the visit note. 

## 2015-07-03 IMAGING — CT CT ANGIOGRAPHY NECK
2 of 3 series · 9 of 14 positions shown · IV contrast (omnipaque)
Comparison: Brain MRI 04/16/2014.

ADDENDUM:
Study discussed by telephone with Provider Suweba Sorgbordjor on 04/27/2014 at
3835 hours.
CLINICAL DATA: 85-year-old female with left MCA infarcts earlier
this month. Subsequent encounter.



[Series 4: cta neck · axial · 0.43mm/px · z∈[+52,+152]mm · 3 of 102 slices shown]
[im 26/102  soft-tissue]
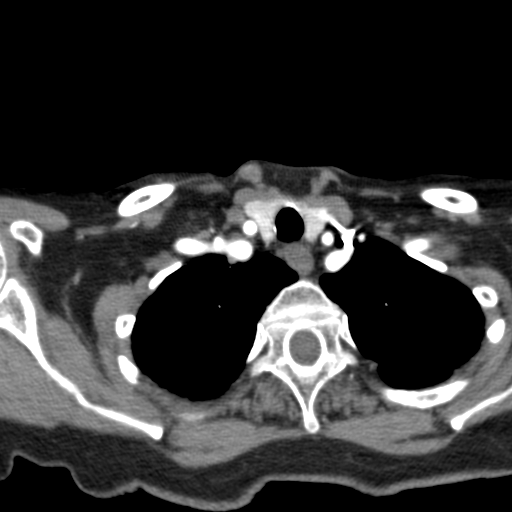
[im 51/102  soft-tissue]
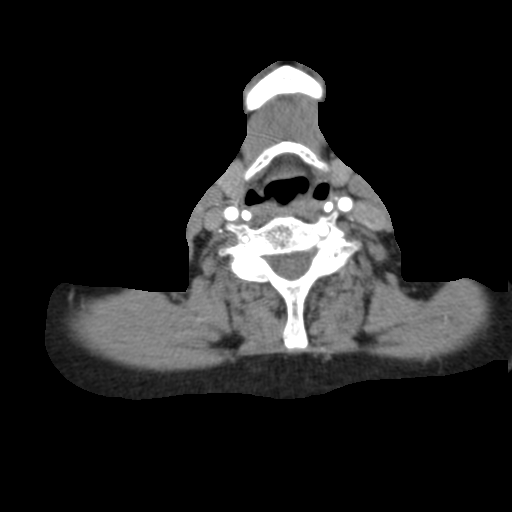
[im 76/102  soft-tissue]
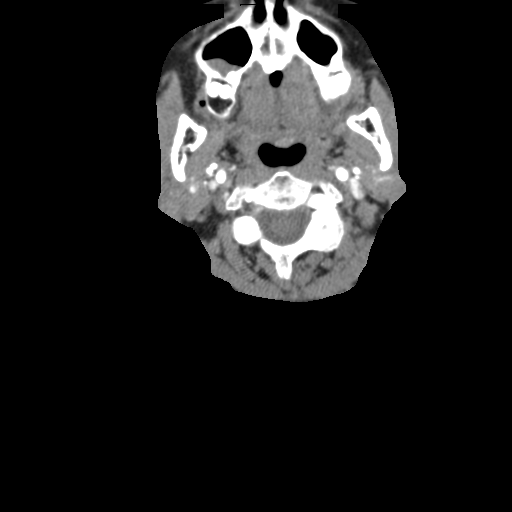

[Series 5: ax thin · axial · 0.39mm/px · z∈[+31,+175]mm · 6 of 202 slices shown]
[im 29/202  soft-tissue]
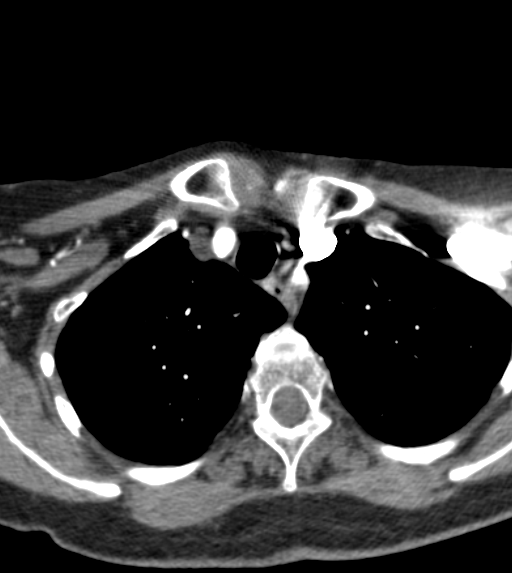
[im 58/202  bone]
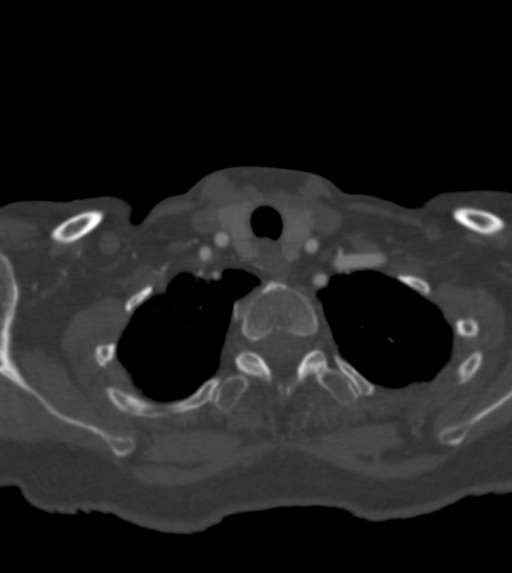
[im 87/202  soft-tissue]
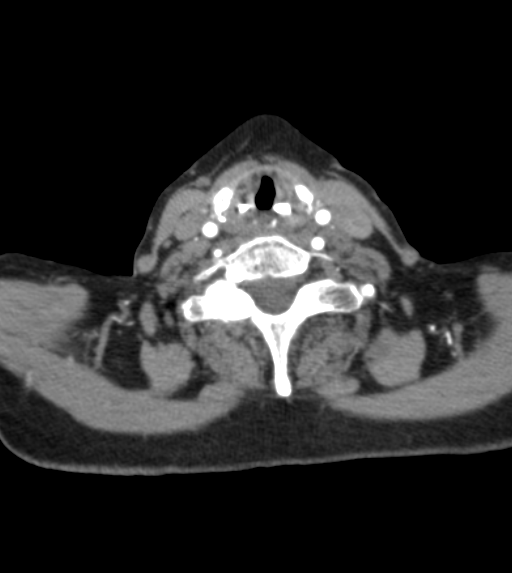
[im 115/202  bone]
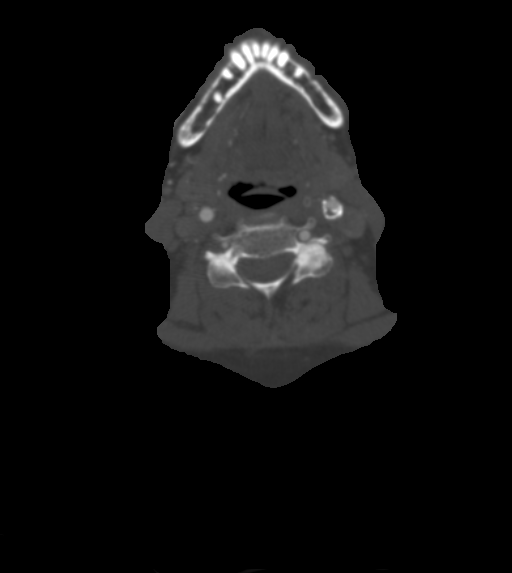
[im 144/202  soft-tissue]
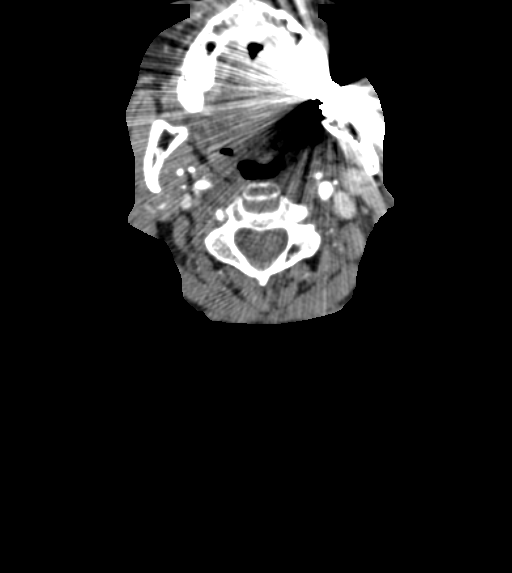
[im 173/202  bone]
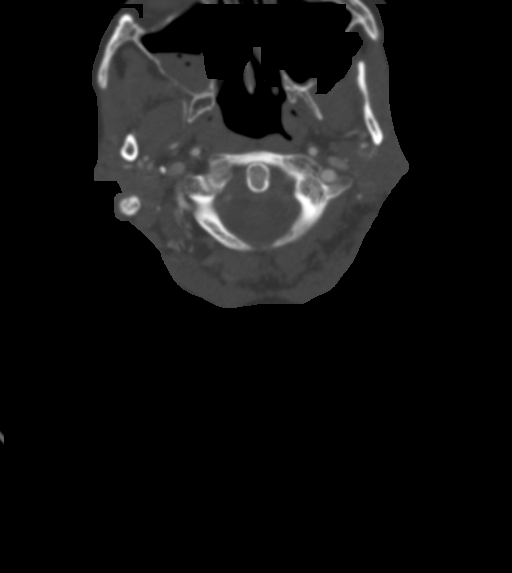

[9 of 14 positions shown; findings below may reference images not displayed]

FINDINGS: Aortic arch: Bovine type arch configuration. Mild to moderate arch
atherosclerosis, mostly affecting the left subclavian artery origin.

Right carotid system: Negative right CCA origin. Negative right CCA
proximal to the right carotid bifurcation. At the bifurcation there
is confluent calcified plaque involving the left ICA origin with
stenosis up to 65-70 % with respect to the distal vessel. See series
7, image 78. Calcified plaque continues in the bulb without
additional hemodynamically significant stenosis. Distal to that the
right ICA is tortuous, but otherwise negative to the skullbase.
Negative visible right ICA siphon.

Left carotid system: Normal left CCA origin. Negative left CCA
proximal to the carotid bifurcation. At the bifurcation there is
bulky plaque, mostly soft plaque but also considerable calcified
plaque, resulting in RADIOGRAPHIC STRING SIGN stenosis over at 10-12
mm segment of the vessel best seen on series 10, image 25. Despite
this the left ICA remains patent. There is a mildly beaded
appearance of the vessel then from this seat to C3 level toward the
skullbase, but no additional stenosis. Negative visible left ICA
siphon.

Vertebral arteries:

No proximal right subclavian artery stenosis. Non dominant right
vertebral artery with no stenosis at its origin. The right vertebral
artery remains diminutive and is negative to the skullbase. It
functionally terminates in the right PICA.

Soft and calcified plaque in the proximal left subclavian artery
resulting in stenosis up to 50 % with respect to the distal vessel.
Dominant left vertebral artery, normal origin. Tortuosity but no
left vertebral artery stenosis to the basilar.

Skeleton: Degenerative changes in the spine. Benign vertebral body
hemangioma at T3. No acute osseous abnormality identified.

Stable paranasal sinus opacification and mucosal thickening. Visible
tympanic cavities and mastoids are clear.

Other neck: No cervical or superior mediastinal lymphadenopathy.
Negative lung apices. Thyroid, larynx, pharynx, parapharyngeal
spaces, retropharyngeal space, sublingual space, submandibular
glands and parotid glands are within normal limits.
IMPRESSION: 1. High-grade RADIOGRAPHIC STRING SIGN stenosis of the proximal
10-12 mm of the left ICA, and large part due to soft plaque.
2. Mostly calcified plaque in the right ICA origin and bulb with up
to 65-70% stenosis with respect to the distal vessel.
3. Possible superimposed FMD of the distal cervical left ICA.
4. No vertebral artery stenosis, dominant left vertebral artery.
Plaque in the proximal left subclavian artery with up to 50%
stenosis.

## 2015-07-25 ENCOUNTER — Other Ambulatory Visit: Payer: Self-pay | Admitting: Family Medicine

## 2015-07-30 ENCOUNTER — Other Ambulatory Visit: Payer: Self-pay | Admitting: Family Medicine

## 2015-08-01 ENCOUNTER — Other Ambulatory Visit: Payer: Self-pay | Admitting: *Deleted

## 2015-08-01 MED ORDER — METOPROLOL TARTRATE 25 MG PO TABS: 25.0000 mg | ORAL_TABLET | Freq: Two times a day (BID) | ORAL | 1 refills | Status: DC

## 2015-10-24 ENCOUNTER — Ambulatory Visit (INDEPENDENT_AMBULATORY_CARE_PROVIDER_SITE_OTHER): Payer: Medicare Other | Admitting: Family Medicine

## 2015-10-24 ENCOUNTER — Ambulatory Visit (INDEPENDENT_AMBULATORY_CARE_PROVIDER_SITE_OTHER)
Admission: RE | Admit: 2015-10-24 | Discharge: 2015-10-24 | Disposition: A | Discharge status: Home / Self Care | Payer: Medicare Other | Source: Ambulatory Visit | Attending: Family Medicine | Admitting: Family Medicine

## 2015-10-24 ENCOUNTER — Encounter: Payer: Self-pay | Admitting: Family Medicine

## 2015-10-24 VITALS — BP 130/80 | HR 73 | Temp 98.6°F | Ht 61.5 in | Wt 113.0 lb

## 2015-10-24 DIAGNOSIS — M7502 Adhesive capsulitis of left shoulder: Secondary | ICD-10-CM

## 2015-10-24 DIAGNOSIS — M25512 Pain in left shoulder: Secondary | ICD-10-CM

## 2015-10-24 MED ORDER — METHYLPREDNISOLONE ACETATE 40 MG/ML IJ SUSP
80.0000 mg | Freq: Once | INTRAMUSCULAR | Status: AC
  Administered 2015-10-24: 80 mg via INTRA_ARTICULAR

## 2015-10-24 NOTE — Patient Instructions (Signed)
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
## 2015-10-24 NOTE — Progress Notes (Signed)
Pre visit review using our clinic review tool, if applicable. No additional management support is needed unless otherwise documented below in the visit note. 

## 2015-10-27 LAB — HM DIABETES EYE EXAM

## 2015-12-05 ENCOUNTER — Ambulatory Visit: Payer: Medicare Other | Admitting: Family Medicine

## 2015-12-12 ENCOUNTER — Ambulatory Visit: Payer: Medicare Other | Admitting: Family Medicine

## 2015-12-19 ENCOUNTER — Ambulatory Visit (INDEPENDENT_AMBULATORY_CARE_PROVIDER_SITE_OTHER): Payer: Medicare Other | Admitting: Family Medicine

## 2015-12-19 ENCOUNTER — Encounter: Payer: Self-pay | Admitting: Family Medicine

## 2015-12-19 VITALS — BP 146/90 | HR 84 | Temp 98.1°F | Ht 61.5 in | Wt 113.5 lb

## 2015-12-19 DIAGNOSIS — M7502 Adhesive capsulitis of left shoulder: Secondary | ICD-10-CM | POA: Diagnosis not present

## 2015-12-19 NOTE — Progress Notes (Signed)
Pre visit review using our clinic review tool, if applicable. No additional management support is needed unless otherwise documented below in the visit note. 
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
## 2015-12-19 NOTE — Patient Instructions (Signed)
Please make appointment for husband Carla BailiffBob Dials. 30 minute procedure appointment, ideally first thing in the morning or after lunch.

## 2016-01-21 ENCOUNTER — Other Ambulatory Visit: Payer: Self-pay | Admitting: Family Medicine

## 2016-02-03 ENCOUNTER — Ambulatory Visit (INDEPENDENT_AMBULATORY_CARE_PROVIDER_SITE_OTHER): Payer: Medicare Other

## 2016-02-03 ENCOUNTER — Telehealth: Payer: Self-pay | Admitting: Family Medicine

## 2016-02-03 VITALS — BP 152/98 | HR 94 | Temp 98.2°F | Ht 61.5 in | Wt 112.5 lb

## 2016-02-03 DIAGNOSIS — Z0000 Encounter for general adult medical examination without abnormal findings: Secondary | ICD-10-CM

## 2016-02-03 DIAGNOSIS — E119 Type 2 diabetes mellitus without complications: Secondary | ICD-10-CM | POA: Diagnosis not present

## 2016-02-03 LAB — LIPID PANEL
CHOL/HDL RATIO: 4
CHOLESTEROL: 332 mg/dL — AB (ref 0–200)
HDL: 93.7 mg/dL (ref >39.00)
LDL Cholesterol: 200 mg/dL — ABNORMAL HIGH (ref 0–99)
NonHDL: 238.31
TRIGLYCERIDES: 193 mg/dL — AB (ref 0.0–149.0)
VLDL: 38.6 mg/dL (ref 0.0–40.0)

## 2016-02-03 LAB — COMPREHENSIVE METABOLIC PANEL
ALBUMIN: 4.4 g/dL (ref 3.5–5.2)
ALK PHOS: 58 U/L (ref 39–117)
ALT: 16 U/L (ref 0–35)
AST: 24 U/L (ref 0–37)
BUN: 27 mg/dL — ABNORMAL HIGH (ref 6–23)
CALCIUM: 9.7 mg/dL (ref 8.4–10.5)
CHLORIDE: 104 meq/L (ref 96–112)
CO2: 31 mEq/L (ref 19–32)
Creatinine, Ser: 0.87 mg/dL (ref 0.40–1.20)
GFR: 65.74 mL/min (ref >60.00)
Glucose, Bld: 121 mg/dL — ABNORMAL HIGH (ref 70–99)
POTASSIUM: 4.8 meq/L (ref 3.5–5.1)
Sodium: 141 mEq/L (ref 135–145)
TOTAL PROTEIN: 7.6 g/dL (ref 6.0–8.3)
Total Bilirubin: 0.7 mg/dL (ref 0.2–1.2)

## 2016-02-03 LAB — HEMOGLOBIN A1C: Hgb A1c MFr Bld: 6 % (ref 4.6–6.5)

## 2016-02-03 NOTE — Patient Instructions (Signed)
Carla Campos , Thank you for taking time to come for your Medicare Wellness Visit. I appreciate your ongoing commitment to your health goals. Please review the following plan we discussed and let me know if I can assist you in the future.   These are the goals we discussed: Goals    . Increase physical activity          Starting 02/03/2016, I will continue to walk at least 15-20 min 3-4 days per week.        This is a list of the screening recommended for you and due dates:  Health Maintenance  Topic Date Due  . Complete foot exam   02/10/2016*  . Hemoglobin A1C  08/02/2016  . Eye exam for diabetics  10/26/2016  . Tetanus Vaccine  07/10/2018  . Flu Shot  Completed  . DEXA scan (bone density measurement)  Completed  . Shingles Vaccine  Completed  . Pneumonia vaccines  Completed  *Topic was postponed. The date shown is not the original due date.   Preventive Care for Adults  A healthy lifestyle and preventive care can promote health and wellness. Preventive health guidelines for adults include the following key practices.  . A routine yearly physical is a good way to check with your health care provider about your health and preventive screening. It is a chance to share any concerns and updates on your health and to receive a thorough exam.  . Visit your dentist for a routine exam and preventive care every 6 months. Brush your teeth twice a day and floss once a day. Good oral hygiene prevents tooth decay and gum disease.  . The frequency of eye exams is based on your age, health, family medical history, use  of contact lenses, and other factors. Follow your health care provider's ecommendations for frequency of eye exams.  . Eat a healthy diet. Foods like vegetables, fruits, whole grains, low-fat dairy products, and lean protein foods contain the nutrients you need without too many calories. Decrease your intake of foods high in solid fats, added sugars, and salt. Eat the right amount  of calories for you. Get information about a proper diet from your health care provider, if necessary.  . Regular physical exercise is one of the most important things you can do for your health. Most adults should get at least 150 minutes of moderate-intensity exercise (any activity that increases your heart rate and causes you to sweat) each week. In addition, most adults need muscle-strengthening exercises on 2 or more days a week.  Silver Sneakers may be a benefit available to you. To determine eligibility, you may visit the website: www.silversneakers.com or contact program at [phone] Mon-Fri between 8AM-8PM.   . Maintain a healthy weight. The body mass index (BMI) is a screening tool to identify possible weight problems. It provides an estimate of body fat based on height and weight. Your health care provider can find your BMI and can help you achieve or maintain a healthy weight.   For adults 20 years and older: ? A BMI below 18.5 is considered underweight. ? A BMI of 18.5 to 24.9 is normal. ? A BMI of 25 to 29.9 is considered overweight. ? A BMI of 30 and above is considered obese.   . Maintain normal blood lipids and cholesterol levels by exercising and minimizing your intake of saturated fat. Eat a balanced diet with plenty of fruit and vegetables. Blood tests for lipids and cholesterol should begin at age  20 and be repeated every 5 years. If your lipid or cholesterol levels are high, you are over 50, or you are at high risk for heart disease, you may need your cholesterol levels checked more frequently. Ongoing high lipid and cholesterol levels should be treated with medicines if diet and exercise are not working.  . If you smoke, find out from your health care provider how to quit. If you do not use tobacco, please do not start.  . If you choose to drink alcohol, please do not consume more than 2 drinks per day. One drink is considered to be 12 ounces (355 mL) of beer, 5 ounces  (148 mL) of wine, or 1.5 ounces (44 mL) of liquor.  . If you are 69-54 years old, ask your health care provider if you should take aspirin to prevent strokes.  . Use sunscreen. Apply sunscreen liberally and repeatedly throughout the day. You should seek shade when your shadow is shorter than you. Protect yourself by wearing long sleeves, pants, a wide-brimmed hat, and sunglasses year round, whenever you are outdoors.  . Once a month, do a whole body skin exam, using a mirror to look at the skin on your back. Tell your health care provider of new moles, moles that have irregular borders, moles that are larger than a pencil eraser, or moles that have changed in shape or color.

## 2016-02-03 NOTE — Progress Notes (Signed)
Pre visit review using our clinic review tool, if applicable. No additional management support is needed unless otherwise documented below in the visit note. 

## 2016-02-03 NOTE — Telephone Encounter (Signed)
labs

## 2016-02-03 NOTE — Progress Notes (Signed)
PCP notes:   Health maintenance:  A1C - completed  Foot exam - PCP will complete at next appt Eye exam - per pt, exam in Oct 2017  Abnormal screenings:   Hearing - failed  Patient concerns:   None  Nurse concerns:  BP 152/98. BP medications taken at night. Pt denies dizziness or blurred vision. Pt is alert and oriented.  Next PCP appt:   02/10/16 @ 1400

## 2016-02-03 NOTE — Progress Notes (Signed)
I reviewed health advisor's note, was available for consultation, and agree with documentation and plan.  Amy Bedsole, MD [facility] [place]  
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
## 2016-02-10 ENCOUNTER — Encounter: Payer: Self-pay | Admitting: Family Medicine

## 2016-02-10 ENCOUNTER — Ambulatory Visit (INDEPENDENT_AMBULATORY_CARE_PROVIDER_SITE_OTHER): Payer: Medicare Other | Admitting: Family Medicine

## 2016-02-10 VITALS — BP 142/70 | HR 100 | Temp 98.1°F | Ht 61.5 in | Wt 114.2 lb

## 2016-02-10 DIAGNOSIS — Z0000 Encounter for general adult medical examination without abnormal findings: Secondary | ICD-10-CM

## 2016-02-10 DIAGNOSIS — I10 Essential (primary) hypertension: Secondary | ICD-10-CM | POA: Diagnosis not present

## 2016-02-10 DIAGNOSIS — E119 Type 2 diabetes mellitus without complications: Secondary | ICD-10-CM

## 2016-02-10 DIAGNOSIS — E7800 Pure hypercholesterolemia, unspecified: Secondary | ICD-10-CM

## 2016-02-10 LAB — HM DIABETES FOOT EXAM

## 2016-02-10 NOTE — Assessment & Plan Note (Signed)
Followed by vascular MD yearly as stable last few checks.  pt refuses cholesterol treatment.

## 2016-02-10 NOTE — Assessment & Plan Note (Signed)
Stable control on no med. 

## 2016-02-10 NOTE — Assessment & Plan Note (Signed)
Well controlled. Continue current medication. Tolerating higher BP given age.

## 2016-02-10 NOTE — Progress Notes (Signed)
Pre visit review using our clinic review tool, if applicable. No additional management support is needed unless otherwise documented below in the visit note. 

## 2016-02-10 NOTE — Assessment & Plan Note (Signed)
Very poor control. Pt refuses statin treatment despite risk of worsening carotid stenosis , CVA and MI.

## 2016-02-10 NOTE — Patient Instructions (Addendum)
Work on low [place] diet. Continue regular exercise.

## 2016-02-10 NOTE — Progress Notes (Signed)
Subjective:    Patient ID: Carla Campos, female    DOB: 10-Dec-1928, 83 y.o.   MRN: [id number]  HPI  The patient saw Lu Duffel, LPN for medicare wellness. Note reviewed in detail and important notes copied below. 02/03/2016 Health maintenance: A1C - completed  Foot exam - PCP will complete at next appt Eye exam - per pt, exam in Oct 2017  Abnormal screenings:  Hearing - failed Patient concerns:  None Nurse concerns: Elevated BP   Left shoulder pain.Marland Kitchen adhesive capsulitis.. Improved with injections and PT.  Elevated Cholesterol:  Poor control.. Pt refused statin or further treatment.  Lab Results  Component Value Date   CHOL 332 (H) 02/03/2016   HDL 93.70 02/03/2016   LDLCALC 200 (H) 02/03/2016   LDLDIRECT 253.3 12/29/2012   TRIG 193.0 (H) 02/03/2016   CHOLHDL 4 02/03/2016  Using medications without problems: Muscle aches:  Diet compliance: Exercise: walking 3-4 times a week Other complaints:    Diabetes:  Stable control on no med.  Lab Results  Component Value Date   HGBA1C 6.0 02/03/2016  Using medications without difficulties: Hypoglycemic episodes:none Hyperglycemic episodes:none Feet problems:none Blood Sugars averaging: not checking eye exam within last year: yes  Hypertension:    Inadequate control on lisinopril 10 mg daily. BP Readings from Last 3 Encounters:  02/10/16 (!) 142/70  02/03/16 (!) 152/98  12/19/15 (!) 146/90  Using medication without problems or lightheadedness:  none Chest pain with exertion: none Edema:none Short of breath: none Average home BPs: 138-145/65-75 Other issues:  Patient Care Team: Excell Seltzer, MD as PCP - General Renford Dills, MD as Consulting Physician (Vascular Surgery) Mia Creek, MD as Consulting Physician (Ophthalmology)  Social History [facility] History/Past Medical History reviewed and updated if needed.  Review of Systems  Constitutional: Negative for fatigue and fever.  HENT:  Negative for ear pain.   Eyes: Negative for pain.  Respiratory: Negative for chest tightness and shortness of breath.   Cardiovascular: Negative for chest pain, palpitations and leg swelling.  Gastrointestinal: Negative for abdominal pain.  Genitourinary: Negative for dysuria.       Objective:   Physical Exam  Constitutional: Vital signs are normal. She appears well-developed and well-nourished. She is cooperative.  Non-toxic appearance. She does not appear ill. No distress.  HENT:  Head: Normocephalic.  Right Ear: Hearing, tympanic membrane, external ear and ear canal normal.  Left Ear: Hearing, tympanic membrane, external ear and ear canal normal.  Nose: Nose normal.  Eyes: Conjunctivae, EOM and lids are normal. Pupils are equal, round, and reactive to light. Lids are everted and swept, no foreign bodies found.  Neck: Trachea normal and normal range of motion. Neck supple. Carotid bruit is not present. No thyroid mass and no thyromegaly present.  Cardiovascular: Normal rate, regular rhythm, S1 normal, S2 normal and intact distal pulses.  Exam reveals no gallop.   Murmur heard.  Systolic murmur is present with a grade of 2/6  Pulmonary/Chest: Effort normal and breath sounds normal. No respiratory distress. She has no wheezes. She has no rhonchi. She has no rales.  Abdominal: Soft. Normal appearance and bowel sounds are normal. She exhibits no distension, no fluid wave, no abdominal bruit and no mass. There is no hepatosplenomegaly. There is no tenderness. There is no rebound, no guarding and no CVA tenderness. No hernia.  Lymphadenopathy:    She has no cervical adenopathy.    She has no axillary adenopathy.  Neurological: She is alert. She has normal  strength. No cranial nerve deficit or sensory deficit.  Skin: Skin is warm, dry and intact. No rash noted.  Psychiatric: Her speech is normal and behavior is normal. Judgment normal. Her mood appears not anxious. Cognition and memory are  normal. She does not exhibit a depressed mood.      Diabetic foot exam: Normal inspection No skin breakdown No calluses  Normal DP pulses Normal sensation to light touch and monofilament Nails normal     Assessment & Plan:  The patient's preventative maintenance and recommended screening tests for an annual wellness exam were reviewed in full today. Brought up to date unless services declined.  Counselled on the importance of diet, exercise, and its role in overall health and mortality. The patient's FH and SH was reviewed, including their home life, tobacco status, and drug and alcohol status.   Vaccines: Uptodate  NWG:[id number]:Refuses to continue evaluating this, she is taking calcium and getting weight bearing exercsie.  Mammogram:Not indicated.  DVE/pap: not indicated  Colon: stool cards neg 2011, no colonoscopy eval indicated. Non smoker

## 2016-03-15 ENCOUNTER — Ambulatory Visit (INDEPENDENT_AMBULATORY_CARE_PROVIDER_SITE_OTHER): Payer: Self-pay | Admitting: Vascular Surgery

## 2016-03-15 ENCOUNTER — Encounter (INDEPENDENT_AMBULATORY_CARE_PROVIDER_SITE_OTHER): Payer: Self-pay

## 2016-04-23 ENCOUNTER — Other Ambulatory Visit: Payer: Self-pay | Admitting: Family Medicine

## 2016-07-06 ENCOUNTER — Telehealth: Payer: Self-pay | Admitting: Family Medicine

## 2016-07-06 NOTE — Telephone Encounter (Signed)
Pt called concerning a bill she received for Feb visits. She called medicare who said it was billed as an office visit so it was denied. She called the phone # on bill and was [facility] and was told it was filled as a wellness visit. Can you please check codes and refile 02/10/16 DOS. Pt is requesting a cb to discuss.

## 2016-08-09 ENCOUNTER — Ambulatory Visit (INDEPENDENT_AMBULATORY_CARE_PROVIDER_SITE_OTHER): Payer: Medicare Other | Admitting: Family Medicine

## 2016-08-09 ENCOUNTER — Encounter: Payer: Self-pay | Admitting: Family Medicine

## 2016-08-09 DIAGNOSIS — I10 Essential (primary) hypertension: Secondary | ICD-10-CM

## 2016-08-09 DIAGNOSIS — I6523 Occlusion and stenosis of bilateral carotid arteries: Secondary | ICD-10-CM | POA: Diagnosis not present

## 2016-08-09 DIAGNOSIS — E119 Type 2 diabetes mellitus without complications: Secondary | ICD-10-CM | POA: Diagnosis not present

## 2016-08-09 DIAGNOSIS — T22111A Burn of first degree of right forearm, initial encounter: Secondary | ICD-10-CM

## 2016-08-09 DIAGNOSIS — T2200XA Burn of unspecified degree of shoulder and upper limb, except wrist and hand, unspecified site, initial encounter: Secondary | ICD-10-CM | POA: Insufficient documentation

## 2016-08-09 LAB — HM DIABETES FOOT EXAM

## 2016-08-09 NOTE — Assessment & Plan Note (Signed)
Good control on no med Encouraged exercise, weight maintanence, healthy eating habits.
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
## 2016-08-09 NOTE — Assessment & Plan Note (Signed)
Has upcoming US of carotids.
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
## 2016-08-09 NOTE — Assessment & Plan Note (Signed)
Healing. Continue current care.

## 2016-08-09 NOTE — Assessment & Plan Note (Signed)
Well-controlled for age.  Continue current medication. 

## 2016-08-10 ENCOUNTER — Other Ambulatory Visit (INDEPENDENT_AMBULATORY_CARE_PROVIDER_SITE_OTHER): Payer: Self-pay | Admitting: Vascular Surgery

## 2016-08-10 DIAGNOSIS — I739 Peripheral vascular disease, unspecified: Principal | ICD-10-CM

## 2016-08-10 DIAGNOSIS — I779 Disorder of arteries and arterioles, unspecified: Secondary | ICD-10-CM

## 2016-08-13 ENCOUNTER — Ambulatory Visit (INDEPENDENT_AMBULATORY_CARE_PROVIDER_SITE_OTHER): Payer: Medicare Other

## 2016-08-13 ENCOUNTER — Encounter (INDEPENDENT_AMBULATORY_CARE_PROVIDER_SITE_OTHER): Payer: Self-pay | Admitting: Vascular Surgery

## 2016-08-13 ENCOUNTER — Ambulatory Visit (INDEPENDENT_AMBULATORY_CARE_PROVIDER_SITE_OTHER): Payer: Medicare Other | Admitting: Vascular Surgery

## 2016-08-13 VITALS — BP 182/88 | HR 86 | Ht 61.0 in | Wt 113.2 lb

## 2016-08-13 DIAGNOSIS — I77811 Abdominal aortic ectasia: Secondary | ICD-10-CM | POA: Diagnosis not present

## 2016-08-13 DIAGNOSIS — I779 Disorder of arteries and arterioles, unspecified: Secondary | ICD-10-CM

## 2016-08-13 DIAGNOSIS — I739 Peripheral vascular disease, unspecified: Principal | ICD-10-CM

## 2016-08-13 DIAGNOSIS — I10 Essential (primary) hypertension: Secondary | ICD-10-CM

## 2016-08-13 DIAGNOSIS — E7800 Pure hypercholesterolemia, unspecified: Secondary | ICD-10-CM

## 2016-08-13 DIAGNOSIS — I6523 Occlusion and stenosis of bilateral carotid arteries: Secondary | ICD-10-CM | POA: Diagnosis not present
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
## 2016-10-22 ENCOUNTER — Other Ambulatory Visit: Payer: Self-pay | Admitting: Family Medicine

## 2016-11-09 LAB — HM DIABETES EYE EXAM

## 2016-11-29 ENCOUNTER — Emergency Department
Admission: EM | Admit: 2016-11-29 | Discharge: 2016-11-29 | Disposition: A | Discharge status: Home / Self Care | Payer: Medicare Other | Attending: Emergency Medicine | Admitting: Emergency Medicine

## 2016-11-29 ENCOUNTER — Encounter: Payer: Self-pay | Admitting: Intensive Care

## 2016-11-29 ENCOUNTER — Ambulatory Visit: Payer: Self-pay

## 2016-11-29 DIAGNOSIS — E119 Type 2 diabetes mellitus without complications: Secondary | ICD-10-CM | POA: Insufficient documentation

## 2016-11-29 DIAGNOSIS — Z8673 Personal history of transient ischemic attack (TIA), and cerebral infarction without residual deficits: Secondary | ICD-10-CM | POA: Diagnosis not present

## 2016-11-29 DIAGNOSIS — I2510 Atherosclerotic heart disease of native coronary artery without angina pectoris: Secondary | ICD-10-CM | POA: Insufficient documentation

## 2016-11-29 DIAGNOSIS — I10 Essential (primary) hypertension: Secondary | ICD-10-CM | POA: Diagnosis present

## 2016-11-29 MED ORDER — METOPROLOL TARTRATE 25 MG PO TABS
25.0000 mg | ORAL_TABLET | Freq: Once | ORAL | Status: AC
  Administered 2016-11-29: 25 mg via ORAL
  Filled 2016-11-29: qty 1

## 2016-11-29 MED ORDER — LISINOPRIL 10 MG PO TABS
10.0000 mg | ORAL_TABLET | Freq: Once | ORAL | Status: AC
  Administered 2016-11-29: 10 mg via ORAL
  Filled 2016-11-29: qty 1

## 2016-11-29 MED ORDER — METOPROLOL TARTRATE 25 MG PO TABS: 12.5000 mg | ORAL_TABLET | Freq: Two times a day (BID) | ORAL | 0 refills | Status: DC

## 2016-11-29 NOTE — ED Triage Notes (Signed)
Patient reports taking her b/p at home with a high reading and called her PCP who told her to come to ER. Blood pressure in triage 231/102. Denies chest pain and SOB. A&O x4. Denies pain anywhere at this time

## 2016-11-29 NOTE — Telephone Encounter (Signed)
Pt. called to report she checked her BP with a new [employer] machine, and it was 205/88 left arm @ approx. 4:45 PM, and 30 minutes later it was 204/97.  Denied any headache, chest pain blurred vision, difficulty breathing, or weakness.   Pt. rechecked BP at 5:00 PM: 232/114 (L) arm, pulse 106.  Pt. rechecked BP at 5:05 PM with the old machine, and was 207/110 (L) arm.  Advised that the protocol recommends the pt. should go to UC  For BP > 200 systolic without cardiac or neurological symptoms.  Pt. agreed with plan.       Reason for Disposition . [1] Systolic BP  >= 200 OR Diastolic >= 120  AND [2] having NO cardiac or neurologic symptoms  Answer Assessment - Initial Assessment Questions 1. BLOOD PRESSURE: "What is the blood pressure?" "Did you take at least two measurements 5 minutes apart?"     BP 205/88 (L) ; 30 minutes later 204/97 (R) 2. ONSET: "When did you take your blood pressure?"     10 min. prior to the call.   3. HOW: "How did you obtain the blood pressure?" (e.g., visiting nurse, automatic home BP monitor)     Automatic machine- [employer] 4. HISTORY: "Do you have a history of high blood pressure?"    Yes; takes Lisinopril  5. MEDICATIONS: "Are you taking any medications for blood pressure?" "Have you missed any doses recently?"     No  6. OTHER SYMPTOMS: "Do you have any symptoms?" (e.g., headache, chest pain, blurred vision, difficulty breathing, weakness)     Denies any complaints. 7. PREGNANCY: "Is there any chance you are pregnant?" "When was your last menstrual period?"     no  Protocols used: HIGH BLOOD PRESSURE-A-AH

## 2016-11-29 NOTE — ED Notes (Signed)
Pt sitting in w/c in lobby with no distress noted, accomp by husband; vs retaken; pt denies any c/o at this time; charge nurse notified of pt's BP

## 2016-11-29 NOTE — Discharge Instructions (Signed)
Please record your blood pressure daily and bring your record with you to your primary care physician's office at your follow-up appointment.  Return to the emergency department if you develop severe pain, chest pain, shortness of breath, lightheadedness or fainting, palpitations, severe headache, numbness tingling or weakness, changes in speech or vision, changes in mental status, or for any other symptoms concerning to you.

## 2016-11-29 NOTE — ED Notes (Signed)
Per MD Sharma CovertNorman give patient home dose of b/p medicine and no other tests needed at this time while waiting on room
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
## 2016-12-07 ENCOUNTER — Other Ambulatory Visit: Payer: Self-pay

## 2016-12-07 ENCOUNTER — Encounter: Payer: Self-pay | Admitting: Family Medicine

## 2016-12-07 ENCOUNTER — Ambulatory Visit (INDEPENDENT_AMBULATORY_CARE_PROVIDER_SITE_OTHER): Payer: Medicare Other | Admitting: Family Medicine

## 2016-12-07 DIAGNOSIS — I10 Essential (primary) hypertension: Secondary | ICD-10-CM

## 2016-12-07 DIAGNOSIS — I6523 Occlusion and stenosis of bilateral carotid arteries: Secondary | ICD-10-CM

## 2016-12-07 MED ORDER — METOPROLOL TARTRATE 25 MG PO TABS: 25.0000 mg | ORAL_TABLET | Freq: Two times a day (BID) | ORAL | 3 refills | Status: AC

## 2016-12-07 MED ORDER — METOPROLOL TARTRATE 25 MG PO TABS: 25.0000 mg | ORAL_TABLET | Freq: Two times a day (BID) | ORAL | 3 refills | Status: DC

## 2016-12-07 NOTE — Assessment & Plan Note (Signed)
Improved control on current  New regimen.  Encouraged exercise, weight maintannce, healthy eating habits.

## 2016-12-07 NOTE — Patient Instructions (Signed)
Continue metoprolol 25 mg twice daily.  If BP dropping low  You can hold the metoprolol..Marland Kitchen

## 2016-12-07 NOTE — Progress Notes (Signed)
Subjective:    Patient ID: Carla Campos, female    DOB: October 28, 1928, 83 y.o.   MRN: [id number]  HPI    83 year old female presents for ER follow up for assymptomatic elevated BP.  Seen in ER on 11/29... BP was 231/102  Summary of ER: Overall, the most likely etiology for the patient's symptoms is the progression of her essential hypertension, as well as discontinuation of her second antihypertensive.  This time, the patient has no neurologic deficits either symptomatically or on examination, and she has no signs or symptoms of hypertensive emergency including ACS or MI.  I will plan to give her beta-blocker here, and restart her on a lower dose of metoprolol at 12.5 mg to see if she can get the antihypertensive benefit without the side effects.     Hypertension:   She is now on lisinopril, low dose lopressor BID. Using medication without problems or lightheadedness:  None  no headache, no vision changes Chest pain with exertion: none Edema: none Short of breath: none Average home BPs: Other issues:   BP Readings from Last 3 Encounters:  12/07/16 128/84  11/29/16 (!) 196/100  08/13/16 (!) 182/88   She has been under a lot of stress lately.Marland Kitchen. Has restarted prilosec for acid . Eating blander diet.    She reports minimal SE.. maybe ittle tired on new med.  Social History /Family History/Past Medical History reviewed in detail and updated in EMR if needed. Blood pressure 128/84, pulse 71, temperature 98.4 F (36.9 C), temperature source Oral, height 5' 1.5" (1.562 m), weight 113 lb (51.3 kg).  Review of Systems  Constitutional: Negative for fatigue and fever.  HENT: Negative for congestion and ear pain.   Eyes: Negative for pain.  Respiratory: Negative for cough, chest tightness and shortness of breath.   Cardiovascular: Negative for chest pain, palpitations and leg swelling.  Gastrointestinal: Negative for abdominal pain.  Genitourinary: Negative for dysuria and vaginal  bleeding.  Musculoskeletal: Negative for back pain.  Neurological: Negative for syncope, light-headedness and headaches.  Psychiatric/Behavioral: Negative for dysphoric mood.       Objective:   Physical Exam  Constitutional: Vital signs are normal. She appears well-developed and well-nourished. She is cooperative.  Non-toxic appearance. She does not appear ill. No distress.  HENT:  Head: Normocephalic.  Right Ear: Hearing, tympanic membrane, external ear and ear canal normal. Tympanic membrane is not erythematous, not retracted and not bulging.  Left Ear: Hearing, tympanic membrane, external ear and ear canal normal. Tympanic membrane is not erythematous, not retracted and not bulging.  Nose: No mucosal edema or rhinorrhea. Right sinus exhibits no maxillary sinus tenderness and no frontal sinus tenderness. Left sinus exhibits no maxillary sinus tenderness and no frontal sinus tenderness.  Mouth/Throat: Uvula is midline, oropharynx is clear and moist and mucous membranes are normal.  Eyes: Conjunctivae, EOM and lids are normal. Pupils are equal, round, and reactive to light. Lids are everted and swept, no foreign bodies found.  Neck: Trachea normal and normal range of motion. Neck supple. Carotid bruit is not present. No thyroid mass and no thyromegaly present.  Cardiovascular: Normal rate, regular rhythm, S1 normal, S2 normal, normal heart sounds, intact distal pulses and normal pulses. Exam reveals no gallop and no friction rub.  No murmur heard. Pulmonary/Chest: Effort normal and breath sounds normal. No tachypnea. No respiratory distress. She has no decreased breath sounds. She has no wheezes. She has no rhonchi. She has no rales.  Abdominal: Soft. Normal  appearance and bowel sounds are normal. There is no tenderness.  Neurological: She is alert.  Skin: Skin is warm, dry and intact. No rash noted.  Psychiatric: Her speech is normal and behavior is normal. Judgment and thought content  normal. Her mood appears not anxious. Cognition and memory are normal. She does not exhibit a depressed mood.          Assessment & Plan:

## 2017-01-18 ENCOUNTER — Other Ambulatory Visit: Payer: Self-pay

## 2017-01-18 ENCOUNTER — Ambulatory Visit (INDEPENDENT_AMBULATORY_CARE_PROVIDER_SITE_OTHER): Payer: Medicare Other | Admitting: Family Medicine

## 2017-01-18 ENCOUNTER — Encounter: Payer: Self-pay | Admitting: Family Medicine

## 2017-01-18 VITALS — BP 124/58 | HR 65 | Temp 98.4°F | Ht 61.5 in | Wt 114.5 lb

## 2017-01-18 DIAGNOSIS — I10 Essential (primary) hypertension: Secondary | ICD-10-CM

## 2017-01-18 DIAGNOSIS — E119 Type 2 diabetes mellitus without complications: Secondary | ICD-10-CM

## 2017-01-18 DIAGNOSIS — F331 Major depressive disorder, recurrent, moderate: Secondary | ICD-10-CM | POA: Diagnosis not present

## 2017-01-18 DIAGNOSIS — R5383 Other fatigue: Secondary | ICD-10-CM

## 2017-01-18 DIAGNOSIS — E559 Vitamin D deficiency, unspecified: Secondary | ICD-10-CM

## 2017-01-18 LAB — COMPREHENSIVE METABOLIC PANEL
ALT: 16 U/L (ref 0–35)
AST: 24 U/L (ref 0–37)
Albumin: 4 g/dL (ref 3.5–5.2)
Alkaline Phosphatase: 57 U/L (ref 39–117)
BUN: 23 mg/dL (ref 6–23)
CO2: 31 meq/L (ref 19–32)
Calcium: 9.4 mg/dL (ref 8.4–10.5)
Chloride: 103 mEq/L (ref 96–112)
Creatinine, Ser: 0.81 mg/dL (ref 0.40–1.20)
GFR: 71.22 mL/min (ref >60.00)
Glucose, Bld: 93 mg/dL (ref 70–99)
POTASSIUM: 4.6 meq/L (ref 3.5–5.1)
SODIUM: 140 meq/L (ref 135–145)
Total Bilirubin: 0.6 mg/dL (ref 0.2–1.2)
Total Protein: 6.8 g/dL (ref 6.0–8.3)

## 2017-01-18 LAB — VITAMIN D 25 HYDROXY (VIT D DEFICIENCY, FRACTURES): VITD: 49.9 ng/mL (ref 30.00–100.00)

## 2017-01-18 LAB — T3, FREE: T3 FREE: 3 pg/mL (ref 2.3–4.2)

## 2017-01-18 LAB — HEMOGLOBIN A1C: Hgb A1c MFr Bld: 6.2 % (ref 4.6–6.5)

## 2017-01-18 LAB — TSH: TSH: 1.3 u[IU]/mL (ref 0.35–4.50)

## 2017-01-18 LAB — T4, FREE: FREE T4: 0.96 ng/dL (ref 0.60–1.60)

## 2017-01-18 MED ORDER — VENLAFAXINE HCL ER 37.5 MG PO CP24: 37.5000 mg | ORAL_CAPSULE | Freq: Every day | ORAL | 3 refills | Status: DC

## 2017-01-18 NOTE — Assessment & Plan Note (Signed)
Due for re-eval. 

## 2017-01-18 NOTE — Patient Instructions (Addendum)
Look into counseling at church.  Start low dose medication for mood.  Can take an additional tablet of metoprolol as needed for BPs > 150/90.  Change AMW and CPX to be one appotinment with Me, Dr. Ermalene SearingBedsole.. No labs prior needed.

## 2017-01-18 NOTE — Progress Notes (Signed)
Subjective:    Patient ID: Carla Campos, female    DOB: 1928/11/02, 84 y.o.   MRN: [id number]  HPI  84 year old female presents with new onset headache for 4 days, sluggish.. No energy. She feels her mood has worsened a lot lately.. Feeling sad, trearful.  decreased motivation. Mood .. " downward spiral in last  Month. She feels very defeated.  She cannot afford counseling.  2016 sertraline 25 mg .. Caused diarrhea    In last  several months her BP and DM control has worsened.  She is on no DM meds. She uses lisinopril daily and metoprolol BID.  At home her BP has been elevated 141-184/ 77-101   She is under a lot of stress at home. She  does not measure blood sugars at home.. Just worried they are high.  She has been eating more sweets.   She is due for re-eval of  A1C and CMET  Last done in 2.2018. Lab Results  Component Value Date   HGBA1C 6.0 02/03/2016    BP Readings from Last 3 Encounters:  01/18/17 (!) 124/58  12/07/16 128/84  11/29/16 (!) 196/100    Review of Systems  Constitutional: Positive for fatigue. Negative for fever.  HENT: Negative for congestion.   Eyes: Negative for pain.  Respiratory: Negative for cough and shortness of breath.   Cardiovascular: Negative for chest pain, palpitations and leg swelling.  Gastrointestinal: Negative for abdominal pain.  Genitourinary: Negative for dysuria and vaginal bleeding.  Musculoskeletal: Negative for back pain.  Neurological: Positive for headaches. Negative for syncope and light-headedness.  Psychiatric/Behavioral: Positive for decreased concentration and dysphoric mood. Negative for self-injury, sleep disturbance and suicidal ideas. The patient is not nervous/anxious.        Objective:   Physical Exam  Constitutional: Vital signs are normal. She appears well-developed and well-nourished. She is cooperative.  Non-toxic appearance. She does not appear ill. No distress.  HENT:  Head: Normocephalic.    Right Ear: Hearing, tympanic membrane, external ear and ear canal normal. Tympanic membrane is not erythematous, not retracted and not bulging.  Left Ear: Hearing, tympanic membrane, external ear and ear canal normal. Tympanic membrane is not erythematous, not retracted and not bulging.  Nose: No mucosal edema or rhinorrhea. Right sinus exhibits no maxillary sinus tenderness and no frontal sinus tenderness. Left sinus exhibits no maxillary sinus tenderness and no frontal sinus tenderness.  Mouth/Throat: Uvula is midline, oropharynx is clear and moist and mucous membranes are normal.  Eyes: Conjunctivae, EOM and lids are normal. Pupils are equal, round, and reactive to light. Lids are everted and swept, no foreign bodies found.  Neck: Trachea normal and normal range of motion. Neck supple. Carotid bruit is not present. No thyroid mass and no thyromegaly present.  Cardiovascular: Normal rate, regular rhythm, S1 normal, S2 normal, normal heart sounds, intact distal pulses and normal pulses. Exam reveals no gallop and no friction rub.  No murmur heard. Pulmonary/Chest: Effort normal and breath sounds normal. No tachypnea. No respiratory distress. She has no decreased breath sounds. She has no wheezes. She has no rhonchi. She has no rales.  Abdominal: Soft. Normal appearance and bowel sounds are normal. There is no tenderness.  Neurological: She is alert.  Skin: Skin is warm, dry and intact. No rash noted.  Psychiatric: Judgment and thought content normal. Her mood appears not anxious. Her speech is delayed. She is slowed and withdrawn. Cognition and memory are normal. She exhibits a depressed mood.  Tearful during exam          Assessment & Plan:

## 2017-01-18 NOTE — Assessment & Plan Note (Signed)
May be high given increased stress and poor mood. Can take an additional metorolol prn if high at home.

## 2017-01-18 NOTE — Assessment & Plan Note (Signed)
Recommended  Counseling.Marland Kitchen. She will look into this.  Start venlafaxine given SE low of diarrhea. SSRI in past caused diarrhea.

## 2017-01-25 ENCOUNTER — Encounter: Payer: Self-pay | Admitting: Family Medicine

## 2017-02-07 ENCOUNTER — Ambulatory Visit: Payer: Medicare Other

## 2017-02-12 ENCOUNTER — Encounter: Payer: Self-pay | Admitting: Family Medicine

## 2017-02-12 ENCOUNTER — Ambulatory Visit (INDEPENDENT_AMBULATORY_CARE_PROVIDER_SITE_OTHER): Payer: Medicare Other | Admitting: Family Medicine

## 2017-02-12 ENCOUNTER — Other Ambulatory Visit: Payer: Self-pay

## 2017-02-12 VITALS — BP 128/76 | HR 77 | Temp 97.8°F | Ht 61.0 in | Wt 110.8 lb

## 2017-02-12 DIAGNOSIS — E7800 Pure hypercholesterolemia, unspecified: Secondary | ICD-10-CM

## 2017-02-12 DIAGNOSIS — I10 Essential (primary) hypertension: Secondary | ICD-10-CM | POA: Diagnosis not present

## 2017-02-12 DIAGNOSIS — I63239 Cerebral infarction due to unspecified occlusion or stenosis of unspecified carotid arteries: Secondary | ICD-10-CM

## 2017-02-12 DIAGNOSIS — E119 Type 2 diabetes mellitus without complications: Secondary | ICD-10-CM

## 2017-02-12 DIAGNOSIS — Z0000 Encounter for general adult medical examination without abnormal findings: Secondary | ICD-10-CM | POA: Diagnosis not present

## 2017-02-12 DIAGNOSIS — F331 Major depressive disorder, recurrent, moderate: Secondary | ICD-10-CM | POA: Diagnosis not present

## 2017-02-12 LAB — HM DIABETES FOOT EXAM

## 2017-02-12 NOTE — Patient Instructions (Signed)
Keep working on low cholesterol diet.

## 2017-02-12 NOTE — Assessment & Plan Note (Signed)
Low chol diet

## 2017-02-12 NOTE — Assessment & Plan Note (Signed)
Well controlled. Continue current medication.  

## 2017-02-12 NOTE — Assessment & Plan Note (Signed)
Plans to go to counseling and to start more activities to combat boredness.  SE to meds, not interested in further trials.

## 2017-02-12 NOTE — Progress Notes (Signed)
Subjective:    Patient ID: Carla Campos, female    DOB: Oct 02, 1928, 84 y.o.   MRN: [id number]  HPI  The patient presents for annual medicare wellness, complete physical and review of chronic health problems. He/She also has the following acute concerns today: None  I have personally reviewed the Medicare Annual Wellness questionnaire and have noted 1. The patient's medical and social history 2. Their use of alcohol, tobacco or illicit drugs 3. Their current medications and supplements 4. The patient's functional ability including ADL's, fall risks, home safety risks and hearing or visual             impairment. 5. Diet and physical activities 6. Evidence for depression or mood disorders 7.         Updated provider list Cognitive evaluation was performed and recorded on pt medicare questionnaire form. The patients weight, height, BMI and visual acuity have been recorded in the chart  I have made referrals, counseling and provided education to the patient based review of the above and I have provided the pt with a written personalized care plan for preventive services.   Documentation of this information was scanned into the electronic record under the media tab.  Hypertension:   on lisinopril and metoprolol.. Can use additional metoprolol prn elevations BP Readings from Last 3 Encounters:  02/12/17 128/76  01/18/17 (!) 124/58  12/07/16 128/84  Using medication without problems or lightheadedness:  none Chest pain with exertion: none Edema:none Short of breath: none Average home BPs: Has not need to take additional metoprolol. Other issues:   MDD, recurrent: At last OV in 01/2017 started  On velnafaxine and recommended counseling.  She reports she was unable to take the venlafaxine due to SE  Of diarrhea.  She reports her mood is better now.. Starting to see counselor this week.. Still having good days and bad days.   Elevated Cholesterol:  Not interested in treating .Marland Kitchen So  not checking. Lab Results  Component Value Date   CHOL 332 (H) 02/03/2016   HDL 93.70 02/03/2016   LDLCALC 200 (H) 02/03/2016   LDLDIRECT 253.3 12/29/2012   TRIG 193.0 (H) 02/03/2016   CHOLHDL 4 02/03/2016   Using medications without problems: Muscle aches:   Diet compliance: good Exercise: minimal Other complaints:  Bilateral carotid : followed by vascular, last doppler 08/2016  Diabetes: good control with Diet.   Lab Results  Component Value Date   HGBA1C 6.2 01/18/2017  Using medications without difficulties: Hypoglycemic episodes: Hyperglycemic episodes: Feet problems: no ulcer Blood Sugars averaging:? eye exam within last year: yes 11/2016  not checking CBGs   Social History /Family History/Past Medical History reviewed in detail and updated in EMR if needed.  Blood pressure 128/76, pulse 77, temperature 97.8 F (36.6 C), temperature source Oral, height 5\' 1"  (1.549 m), weight 110 lb 12 oz (50.2 kg).  Advance directives and end of life planning reviewed in detail with patient and documented in EMR. Patient given handout on advance care directives if needed. HCPOA and living will updated if needed. Fall Risk  02/12/2017 02/03/2016 01/13/2015 01/05/2014 01/02/2013  Falls in the past year? No No No No No   Depression screen [facility] 2/9 02/12/2017 02/03/2016 01/13/2015  Decreased Interest 1 0 0  Down, Depressed, Hopeless 0 0 0  PHQ - 2 Score 1 0 0    Hearing Screening   Method: Audiometry   125Hz  250Hz  500Hz  1000Hz  2000Hz  3000Hz  4000Hz  6000Hz  8000Hz   Right ear:   0  0 40  20    Left ear:   0 40 40  40    Vision Screening Comments: Eye Exam 09/2016 with Dr. Vonna KotykBevis    Review of Systems  Constitutional: Negative for fatigue and fever.  HENT: Negative for congestion.   Eyes: Negative for pain.  Respiratory: Negative for cough and shortness of breath.   Cardiovascular: Negative for chest pain, palpitations and leg swelling.  Gastrointestinal: Negative for abdominal pain.    Genitourinary: Negative for dysuria and vaginal bleeding.  Musculoskeletal: Negative for back pain.  Neurological: Negative for syncope, light-headedness and headaches.  Psychiatric/Behavioral: Negative for dysphoric mood.       Objective:   Physical Exam  Constitutional: Vital signs are normal. She appears well-developed and well-nourished. She is cooperative.  Non-toxic appearance. She does not appear ill. No distress.  HENT:  Head: Normocephalic.  Right Ear: Hearing, tympanic membrane, external ear and ear canal normal.  Left Ear: Hearing, tympanic membrane, external ear and ear canal normal.  Nose: Nose normal.  Eyes: Conjunctivae, EOM and lids are normal. Pupils are equal, round, and reactive to light. Lids are everted and swept, no foreign bodies found.  Neck: Trachea normal and normal range of motion. Neck supple. Carotid bruit is not present. No thyroid mass and no thyromegaly present.  Cardiovascular: Normal rate, regular rhythm, S1 normal, S2 normal, normal heart sounds and intact distal pulses. Exam reveals no gallop.  No murmur heard. Pulmonary/Chest: Effort normal and breath sounds normal. No respiratory distress. She has no wheezes. She has no rhonchi. She has no rales.  Abdominal: Soft. Normal appearance and bowel sounds are normal. She exhibits no distension, no fluid wave, no abdominal bruit and no mass. There is no hepatosplenomegaly. There is no tenderness. There is no rebound, no guarding and no CVA tenderness. No hernia.  Lymphadenopathy:    She has no cervical adenopathy.    She has no axillary adenopathy.  Neurological: She is alert. She has normal strength. No cranial nerve deficit or sensory deficit.  Skin: Skin is warm, dry and intact. No rash noted.  Psychiatric: Her speech is normal and behavior is normal. Judgment normal. Her mood appears not anxious. Cognition and memory are normal. She does not exhibit a depressed mood.    Diabetic foot exam: Normal  inspection No skin breakdown No calluses  Normal DP pulses Normal sensation to light touch and monofilament Nails normal     Assessment & Plan:  The patient's preventative maintenance and recommended screening tests for an annual wellness exam were reviewed in full today. Brought up to date unless services declined.  Counselled on the importance of diet, exercise, and its role in overall health and mortality. The patient's FH and SH was reviewed, including their home life, tobacco status, and drug and alcohol status.   Vaccines: Uptodate  ZOX:[id number]:Refuses to continue evaluating this, she is taking calcium and getting weight bearing exercsie.  Mammogram:Not indicated.  DVE/pap: not indicated  Colon: stool cards neg 2011, no colonoscopy eval indicated. Non smoker

## 2017-02-12 NOTE — Assessment & Plan Note (Signed)
Followed by Vascular, last US carotids 08/2016.

## 2017-02-12 NOTE — Assessment & Plan Note (Signed)
Good control with diet. 

## 2017-04-24 ENCOUNTER — Other Ambulatory Visit: Payer: Self-pay | Admitting: Family Medicine

## 2017-07-09 ENCOUNTER — Telehealth: Payer: Self-pay | Admitting: Cardiovascular Disease

## 2017-07-09 NOTE — Telephone Encounter (Signed)
Pt states after she has been up walking around she feels weak, and SOB, states if she bends over she feels dizzy. Denies any CP

## 2017-07-09 NOTE — Telephone Encounter (Signed)
S/w patient. She has not been seen by Dr Mariah MillingGollan since 2016 but would like to schedule with him. She's been having increased periods of lightheadedness upon standing or bending over. She frequently feels weak and tired and like her legs want to give out. Offered appointment with PA tomorrow but she prefers to see Dr Mariah MillingGollan. Patient scheduled with Dr Mariah MillingGollan.

## 2017-07-10 ENCOUNTER — Telehealth: Payer: Self-pay

## 2017-07-10 NOTE — Telephone Encounter (Signed)
Carla Campos with [facility] said pt is feeling depressed but no SI/HI. Per pts med list pt is not on any med for depression. Pt scheduled with Dr Ermalene SearingBedsole on 07/11/17 at 9 AM. If pt condition changes or worsens prior to appt pt will go to ED. Carla Campos voiced understanding and will let pt know. FYI to Dr Ermalene SearingBedsole.

## 2017-07-11 ENCOUNTER — Ambulatory Visit (INDEPENDENT_AMBULATORY_CARE_PROVIDER_SITE_OTHER)
Admission: RE | Admit: 2017-07-11 | Discharge: 2017-07-11 | Disposition: A | Discharge status: Home / Self Care | Payer: Medicare Other | Source: Ambulatory Visit | Attending: Family Medicine | Admitting: Family Medicine

## 2017-07-11 ENCOUNTER — Encounter: Payer: Self-pay | Admitting: Family Medicine

## 2017-07-11 ENCOUNTER — Ambulatory Visit (INDEPENDENT_AMBULATORY_CARE_PROVIDER_SITE_OTHER): Payer: Medicare Other | Admitting: Family Medicine

## 2017-07-11 VITALS — BP 132/76 | HR 92 | Temp 98.2°F | Ht 61.0 in | Wt 112.0 lb

## 2017-07-11 DIAGNOSIS — R630 Anorexia: Secondary | ICD-10-CM | POA: Diagnosis not present

## 2017-07-11 DIAGNOSIS — I63239 Cerebral infarction due to unspecified occlusion or stenosis of unspecified carotid arteries: Secondary | ICD-10-CM

## 2017-07-11 DIAGNOSIS — Z800 Family history of malignant neoplasm of digestive organs: Secondary | ICD-10-CM

## 2017-07-11 DIAGNOSIS — K529 Noninfective gastroenteritis and colitis, unspecified: Secondary | ICD-10-CM

## 2017-07-11 DIAGNOSIS — M5442 Lumbago with sciatica, left side: Secondary | ICD-10-CM | POA: Diagnosis not present

## 2017-07-11 DIAGNOSIS — M5441 Lumbago with sciatica, right side: Secondary | ICD-10-CM

## 2017-07-11 DIAGNOSIS — G8929 Other chronic pain: Secondary | ICD-10-CM

## 2017-07-11 DIAGNOSIS — R29898 Other symptoms and signs involving the musculoskeletal system: Secondary | ICD-10-CM | POA: Diagnosis not present

## 2017-07-11 DIAGNOSIS — M545 Low back pain: Secondary | ICD-10-CM

## 2017-07-11 DIAGNOSIS — F331 Major depressive disorder, recurrent, moderate: Secondary | ICD-10-CM

## 2017-07-11 MED ORDER — BUPROPION HCL ER (XL) 150 MG PO TB24: 150.0000 mg | ORAL_TABLET | Freq: Every day | ORAL | 11 refills | Status: DC

## 2017-07-11 NOTE — Assessment & Plan Note (Signed)
Depressive symptoms.. Start wellbutrin and refer to counselor. Pt already has follow up.

## 2017-07-11 NOTE — Assessment & Plan Note (Signed)
Discussed intermittent gassy diarrhea.. Likely IBS. Pt worried about fathers dx of stomach cancer and desires further work up with possible endoscopy and colonoscopy. Offered cholestyramine for diarrhea.. Pt refused. Recommended starting of FOD MAP diet. Info given.

## 2017-07-11 NOTE — Assessment & Plan Note (Signed)
Normal pulses.. Doubt PAD.  Concerinng for spinal stenosis.Marland Kitchen. eval X-ray of low back

## 2017-07-11 NOTE — Progress Notes (Signed)
Subjective:    Patient ID: Carla Campos, female    DOB: Dec 22, 1928, 84 y.o.   MRN: [id number]  HPI   84 year old female presents with depression, worse in last few weeks.  She wants to cry all the time. She has minimal pleasure in doing things, sleeping to much.  Sleeps fine at night. Feeling very tired overall.  She has been feeling progressive weakness in legs with walking. Resting makes it go away. No pain in bilateral legs. She has poor balance. She has low back pain worst with standing at sink.. Goes away when she lies down to rest.   She has a history of similar but has been hesitant abut medication given SE in past. SSRI and venlafaxine cause diarrhea in past.  She is worried about her chronic diarrhea  (intermittant spells, loose, lots of gas)and stomach ache, decreased appetite.  Father died stomach cancer.  Blood pressure 132/76, pulse 92, temperature 98.2 F (36.8 C), temperature source Oral, height 5\' 1"  (1.549 m), weight 112 lb (50.8 kg), SpO2 99 %. Social History /Family History/Past Medical History reviewed in detail and updated in EMR if needed.  Review of Systems  Constitutional: Negative for fatigue and fever.  HENT: Negative for ear pain.   Eyes: Negative for pain.  Respiratory: Negative for chest tightness and shortness of breath.   Cardiovascular: Negative for chest pain, palpitations and leg swelling.  Gastrointestinal: Negative for abdominal pain.  Genitourinary: Negative for dysuria.       Objective:   Physical Exam  Constitutional: Vital signs are normal. She appears well-developed and well-nourished. She is cooperative.  Non-toxic appearance. She does not appear ill. No distress.  Tearful, elderly female  HENT:  Head: Normocephalic.  Right Ear: Hearing, tympanic membrane, external ear and ear canal normal. Tympanic membrane is not erythematous, not retracted and not bulging.  Left Ear: Hearing, tympanic membrane, external ear and ear canal  normal. Tympanic membrane is not erythematous, not retracted and not bulging.  Nose: No mucosal edema or rhinorrhea. Right sinus exhibits no maxillary sinus tenderness and no frontal sinus tenderness. Left sinus exhibits no maxillary sinus tenderness and no frontal sinus tenderness.  Mouth/Throat: Uvula is midline, oropharynx is clear and moist and mucous membranes are normal.  Eyes: Pupils are equal, round, and reactive to light. Conjunctivae, EOM and lids are normal. Lids are everted and swept, no foreign bodies found.  Neck: Trachea normal and normal range of motion. Neck supple. Carotid bruit is not present. No thyroid mass and no thyromegaly present.  Cardiovascular: Normal rate, regular rhythm, S1 normal, S2 normal, normal heart sounds, intact distal pulses and normal pulses. Exam reveals no gallop and no friction rub.  No murmur heard. Pulses:      Dorsalis pedis pulses are 2+ on the right side, and 2+ on the left side.       Posterior tibial pulses are 2+ on the right side, and 2+ on the left side.  Pulmonary/Chest: Effort normal and breath sounds normal. No tachypnea. No respiratory distress. She has no decreased breath sounds. She has no wheezes. She has no rhonchi. She has no rales.  Abdominal: Soft. Normal appearance and bowel sounds are normal. There is no tenderness.  Musculoskeletal:       Lumbar back: She exhibits decreased range of motion. She exhibits no tenderness and no bony tenderness.  Neurological: She is alert.  Skin: Skin is warm, dry and intact. No rash noted.  Psychiatric: Her speech is normal  and behavior is normal. Judgment and thought content normal. Her mood appears not anxious. Cognition and memory are normal. She does not exhibit a depressed mood.          Assessment & Plan:

## 2017-07-11 NOTE — Patient Instructions (Addendum)
Start wellbutrin daily in AM for mood.  Look into counseling... Call if you need a referral. We will call with X-ray results. Please stop at the front desk to set up referral. Can also try FOD MAP diet.

## 2017-07-17 ENCOUNTER — Ambulatory Visit: Payer: Medicare Other | Admitting: Cardiovascular Disease

## 2017-07-26 ENCOUNTER — Ambulatory Visit: Payer: Self-pay | Admitting: *Deleted

## 2017-07-26 NOTE — Telephone Encounter (Signed)
Agree.. If BP truly that high pt needs to be seen ASAP at urgent care/ER.

## 2017-07-26 NOTE — Telephone Encounter (Signed)
Pt reports B/P yesterday at 1800... 300/190. States she took 3 (30mg ) of lisinopril at hs instead of 10 mg as prescribed. This am reports BP 211/111 (0600) and prior to NT 190/92.  During call 222/105  HR 74. Pt denies any symptoms; states "Slight headache this am" , none presently. Takes lopressor 25mg  BID in am and HS; has not taken this am.  Pt states "I think this is from the Wellbutrin I started taking about 8 days ago." Directed pt to UC, declines. States she went to UC 6 months ago for elevated BP, "They just gave me extra Lisinopril, I can do that at home." NT placed call to practice; spoke to [place]. States she will alert Dr. Ermalene SearingBedsole. Dr. Ermalene SearingBedsole reiterates disposition. Pt made aware, instructed to take BP monitor with her to UC. States she is unsure if she will follow disposition. Care advise given per protocol.  Reason for Disposition . [1] Systolic BP  >= 200 OR Diastolic >= 120  AND [2] having NO cardiac or neurologic symptoms  Answer Assessment - Initial Assessment Questions 1. BLOOD PRESSURE: "What is the blood pressure?" "Did you take at least two measurements 5 minutes apart?"     222/105  HR 74  During call 2. ONSET: "When did you take your blood pressure?"     0900 211/111  and earlier this am about 0600...190/92 3. HOW: "How did you obtain the blood pressure?" (e.g., visiting nurse, automatic home BP monitor)     Home monitor 4. HISTORY: "Do you have a history of high blood pressure?"     Yes 5. MEDICATIONS: "Are you taking any medications for blood pressure?" "Have you missed any doses recently?"     no 6. OTHER SYMPTOMS: "Do you have any symptoms?" (e.g., headache, chest pain, blurred vision, difficulty breathing, weakness)     Slight headache this am, none presently.  Protocols used: HIGH BLOOD PRESSURE-A-AH

## 2017-07-26 NOTE — Telephone Encounter (Signed)
Chales AbrahamsMary Ann RN called, 07/25/17 BP 300/190;pt took Lisinopril 30 mg and Metoprolol 25 mg last night. Today during call BP 222/105, no symptoms and pt feels fine. Pt thinks wellbutrin may be causing elevated BP.  Dr Ermalene SearingBedsole recommends pt go to UC and take BP cuff with her to have accuracy of cuff checked and for pt to be evaluated.

## 2017-07-26 NOTE — Telephone Encounter (Signed)
I spoke with pt and she said she has taken lisinopril 10 mg and metoprolol 25 mg this morning. Pt will monitor her BP and if BP continues to be elevated pt will go to UC. Pt said she is confident her BP cuff is accurate. Advised pt again Dr Ermalene SearingBedsole recommendation and pt voiced understanding and will go to UC if BP continues to be elevated. FYI to Dr Ermalene SearingBedsole.

## 2017-07-29 ENCOUNTER — Emergency Department: Payer: Medicare Other

## 2017-07-29 ENCOUNTER — Other Ambulatory Visit: Payer: Self-pay

## 2017-07-29 ENCOUNTER — Ambulatory Visit: Payer: Self-pay | Admitting: *Deleted

## 2017-07-29 ENCOUNTER — Inpatient Hospital Stay
Admission: EM | Admit: 2017-07-29 | Discharge: 2017-07-30 | DRG: 305 | Disposition: A | Discharge status: Home / Self Care | Payer: Medicare Other | Attending: Specialist | Admitting: Specialist

## 2017-07-29 ENCOUNTER — Encounter: Payer: Self-pay | Admitting: Emergency Medicine

## 2017-07-29 DIAGNOSIS — I10 Essential (primary) hypertension: Secondary | ICD-10-CM | POA: Diagnosis present

## 2017-07-29 DIAGNOSIS — F329 Major depressive disorder, single episode, unspecified: Secondary | ICD-10-CM | POA: Diagnosis present

## 2017-07-29 DIAGNOSIS — K219 Gastro-esophageal reflux disease without esophagitis: Secondary | ICD-10-CM | POA: Diagnosis present

## 2017-07-29 DIAGNOSIS — M1990 Unspecified osteoarthritis, unspecified site: Secondary | ICD-10-CM | POA: Diagnosis present

## 2017-07-29 DIAGNOSIS — E785 Hyperlipidemia, unspecified: Secondary | ICD-10-CM | POA: Diagnosis present

## 2017-07-29 DIAGNOSIS — I161 Hypertensive emergency: Secondary | ICD-10-CM | POA: Diagnosis not present

## 2017-07-29 DIAGNOSIS — Z66 Do not resuscitate: Secondary | ICD-10-CM | POA: Diagnosis present

## 2017-07-29 DIAGNOSIS — Z8249 Family history of ischemic heart disease and other diseases of the circulatory system: Secondary | ICD-10-CM

## 2017-07-29 DIAGNOSIS — E119 Type 2 diabetes mellitus without complications: Secondary | ICD-10-CM | POA: Diagnosis present

## 2017-07-29 DIAGNOSIS — Z79899 Other long term (current) drug therapy: Secondary | ICD-10-CM | POA: Diagnosis not present

## 2017-07-29 DIAGNOSIS — Z823 Family history of stroke: Secondary | ICD-10-CM | POA: Diagnosis not present

## 2017-07-29 DIAGNOSIS — Z7982 Long term (current) use of aspirin: Secondary | ICD-10-CM | POA: Diagnosis not present

## 2017-07-29 DIAGNOSIS — Z8673 Personal history of transient ischemic attack (TIA), and cerebral infarction without residual deficits: Secondary | ICD-10-CM | POA: Diagnosis not present

## 2017-07-29 DIAGNOSIS — Z888 Allergy status to other drugs, medicaments and biological substances status: Secondary | ICD-10-CM

## 2017-07-29 LAB — BASIC METABOLIC PANEL
ANION GAP: 10 (ref 5–15)
BUN: 20 mg/dL (ref 8–23)
CO2: 27 mmol/L (ref 22–32)
Calcium: 9.5 mg/dL (ref 8.9–10.3)
Chloride: 108 mmol/L (ref 98–111)
Creatinine, Ser: 1.12 mg/dL — ABNORMAL HIGH (ref 0.44–1.00)
GFR calc Af Amer: 50 mL/min — ABNORMAL LOW (ref >60)
GFR, EST NON AFRICAN AMERICAN: 43 mL/min — AB (ref >60)
Glucose, Bld: 128 mg/dL — ABNORMAL HIGH (ref 70–99)
POTASSIUM: 4.9 mmol/L (ref 3.5–5.1)
Sodium: 145 mmol/L (ref 135–145)

## 2017-07-29 LAB — CBC
HEMATOCRIT: 46.5 % (ref 35.0–47.0)
HEMOGLOBIN: 16.1 g/dL — AB (ref 12.0–16.0)
MCH: 30.8 pg (ref 26.0–34.0)
MCHC: 34.7 g/dL (ref 32.0–36.0)
MCV: 88.7 fL (ref 80.0–100.0)
Platelets: 261 10*3/uL (ref 150–440)
RBC: 5.24 MIL/uL — ABNORMAL HIGH (ref 3.80–5.20)
RDW: 13.2 % (ref 11.5–14.5)
WBC: 8.6 10*3/uL (ref 3.6–11.0)

## 2017-07-29 LAB — TROPONIN I: Troponin I: <0.03 ng/mL (ref <0.03)

## 2017-07-29 MED ORDER — HYDROCODONE-ACETAMINOPHEN 5-325 MG PO TABS: 1.0000 | ORAL_TABLET | ORAL | Status: DC | PRN

## 2017-07-29 MED ORDER — SODIUM CHLORIDE 0.9% FLUSH
3.0000 mL | Freq: Two times a day (BID) | INTRAVENOUS | Status: DC
  Administered 2017-07-29 – 2017-07-30 (×2): 3 mL via INTRAVENOUS

## 2017-07-29 MED ORDER — LISINOPRIL 10 MG PO TABS
10.0000 mg | ORAL_TABLET | Freq: Every day | ORAL | Status: DC
  Administered 2017-07-30: 10 mg via ORAL
  Filled 2017-07-29: qty 1

## 2017-07-29 MED ORDER — LISINOPRIL 10 MG PO TABS
10.0000 mg | ORAL_TABLET | Freq: Once | ORAL | Status: AC
  Administered 2017-07-29: 10 mg via ORAL
  Filled 2017-07-29: qty 1

## 2017-07-29 MED ORDER — LISINOPRIL 5 MG PO TABS: 5.0000 mg | ORAL_TABLET | Freq: Once | ORAL | Status: DC

## 2017-07-29 MED ORDER — LISINOPRIL 5 MG PO TABS
5.0000 mg | ORAL_TABLET | Freq: Once | ORAL | Status: AC
  Administered 2017-07-29: 5 mg via ORAL
  Filled 2017-07-29: qty 1

## 2017-07-29 MED ORDER — ONDANSETRON HCL 4 MG/2ML IJ SOLN: 4.0000 mg | Freq: Four times a day (QID) | INTRAMUSCULAR | Status: DC | PRN

## 2017-07-29 MED ORDER — ENOXAPARIN SODIUM 40 MG/0.4ML ~~LOC~~ SOLN: 40.0000 mg | SUBCUTANEOUS | Status: DC

## 2017-07-29 MED ORDER — KETOROLAC TROMETHAMINE 15 MG/ML IJ SOLN: 15.0000 mg | Freq: Four times a day (QID) | INTRAMUSCULAR | Status: DC | PRN

## 2017-07-29 MED ORDER — METOPROLOL TARTRATE 25 MG PO TABS
25.0000 mg | ORAL_TABLET | Freq: Two times a day (BID) | ORAL | Status: DC
  Administered 2017-07-30: 25 mg via ORAL
  Filled 2017-07-29: qty 1

## 2017-07-29 MED ORDER — ONDANSETRON HCL 4 MG PO TABS: 4.0000 mg | ORAL_TABLET | Freq: Four times a day (QID) | ORAL | Status: DC | PRN

## 2017-07-29 MED ORDER — METOPROLOL TARTRATE 5 MG/5ML IV SOLN
2.5000 mg | Freq: Once | INTRAVENOUS | Status: AC
  Administered 2017-07-29: 2.5 mg via INTRAVENOUS
  Filled 2017-07-29: qty 5

## 2017-07-29 MED ORDER — HYDRALAZINE HCL 20 MG/ML IJ SOLN
10.0000 mg | Freq: Four times a day (QID) | INTRAMUSCULAR | Status: DC | PRN
  Administered 2017-07-29: 10 mg via INTRAVENOUS
  Filled 2017-07-29: qty 1

## 2017-07-29 MED ORDER — LABETALOL HCL 5 MG/ML IV SOLN: 20.0000 mg | INTRAVENOUS | Status: DC | PRN

## 2017-07-29 MED ORDER — SODIUM CHLORIDE 0.9 % IV SOLN: 250.0000 mL | INTRAVENOUS | Status: DC | PRN

## 2017-07-29 MED ORDER — BISACODYL 5 MG PO TBEC: 5.0000 mg | DELAYED_RELEASE_TABLET | Freq: Every day | ORAL | Status: DC | PRN

## 2017-07-29 MED ORDER — ALBUTEROL SULFATE (2.5 MG/3ML) 0.083% IN NEBU: 2.5000 mg | INHALATION_SOLUTION | RESPIRATORY_TRACT | Status: DC | PRN

## 2017-07-29 MED ORDER — SODIUM CHLORIDE 0.9% FLUSH
3.0000 mL | INTRAVENOUS | Status: DC | PRN
  Administered 2017-07-30: 3 mL via INTRAVENOUS
  Filled 2017-07-29: qty 3

## 2017-07-29 MED ORDER — SENNOSIDES-DOCUSATE SODIUM 8.6-50 MG PO TABS: 1.0000 | ORAL_TABLET | Freq: Every evening | ORAL | Status: DC | PRN

## 2017-07-29 MED ORDER — ACETAMINOPHEN 650 MG RE SUPP: 650.0000 mg | Freq: Four times a day (QID) | RECTAL | Status: DC | PRN

## 2017-07-29 MED ORDER — LABETALOL HCL 5 MG/ML IV SOLN
20.0000 mg | Freq: Once | INTRAVENOUS | Status: AC
  Administered 2017-07-29: 10 mg via INTRAVENOUS
  Filled 2017-07-29: qty 4

## 2017-07-29 MED ORDER — HYDRALAZINE HCL 20 MG/ML IJ SOLN
20.0000 mg | INTRAMUSCULAR | Status: DC | PRN
  Administered 2017-07-30: 20 mg via INTRAVENOUS
  Filled 2017-07-29: qty 1

## 2017-07-29 MED ORDER — ACETAMINOPHEN 325 MG PO TABS: 650.0000 mg | ORAL_TABLET | Freq: Four times a day (QID) | ORAL | Status: DC | PRN

## 2017-07-29 MED ORDER — METOPROLOL TARTRATE 25 MG PO TABS: 25.0000 mg | ORAL_TABLET | Freq: Once | ORAL | Status: DC

## 2017-07-29 MED ORDER — ASPIRIN 81 MG PO CHEW
324.0000 mg | CHEWABLE_TABLET | Freq: Once | ORAL | Status: AC
  Administered 2017-07-29: 324 mg via ORAL
  Filled 2017-07-29: qty 4

## 2017-07-29 NOTE — ED Notes (Signed)
Pt c/o weakness, chest pain and headache. Recent hx of taking Wellbutrin but stopped this medication. Pt resting comfortably in bed at this time. No distress noted.

## 2017-07-29 NOTE — Telephone Encounter (Addendum)
Pt called with having and elevated b/p and having a headache. The first reading is 184/103. The next two readings were  226/123 and 220/116 with heart rates of 76 and 75.  Pt also is stating that she feels weak all over (fatigue). She denies blurred vision, or shortness of breath. She has not missed any of her blood pressure medications.  Advised that he husband take he to the closest emergency department. She voiced understanding.  [facility]   Reason for Disposition . [1] Systolic BP  >= 160 OR Diastolic >= 100 AND [2] cardiac or neurologic symptoms (e.g., chest pain, difficulty breathing, unsteady gait, blurred vision)  Answer Assessment - Initial Assessment Questions 1. BLOOD PRESSURE: "What is the blood pressure?" "Did you take at least two measurements 5 minutes apart?"     184/103  And  226/123 HR 76 2. ONSET: "When did you take your blood pressure?"     Just now 3. HOW: "How did you obtain the blood pressure?" (e.g., visiting nurse, automatic home BP monitor)     Automatic home BP monitor 4. HISTORY: "Do you have a history of high blood pressure?"     yes 5. MEDICATIONS: "Are you taking any medications for blood pressure?" "Have you missed any doses recently?"     On meds and not missed any doses 6. OTHER SYMPTOMS: "Do you have any symptoms?" (e.g., headache, chest pain, blurred vision, difficulty breathing, weakness)     Headache, weakness 7. PREGNANCY: "Is there any chance you are pregnant?" "When was your last menstrual period?"     no  Protocols used: HIGH BLOOD PRESSURE-A-AH

## 2017-07-29 NOTE — Telephone Encounter (Signed)
Unable to reach pt to verify pt is going to ED.

## 2017-07-29 NOTE — Progress Notes (Signed)
Advanced Care Plan.  Purpose of Encounter: CODE STATUS. Parties in Attendance: The patient and me. Patient's Decisional Capacity: Yes. Medical Story: Carla Campos  is a 84 y.o. female with a known history of hypertension, carotid arterial disease, hyperlipidemia, stroke and depression.  The patient presented to the ED with high blood pressure and headache. She was found hypoxia pressure at 236/109 in the ED.  She was given Lopressor IV twice, lisinopril and Lopressor p.o. without improvement. She is being admitted for hypertensive emergency.  Discussed with patient about her current condition, prognosis and CODE STATUS.  The patient stated that she does not want to be resuscitated or intubated.  She wants DNR.  Plan:  Code Status: DNR. Time spent discussing advance care planning: 17 minutes.
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
## 2017-07-29 NOTE — Addendum Note (Signed)
Addended by: Patience MuscaISLEY, Horace Wishon M on: 07/29/2017 10:27 AM   Modules accepted: Kipp BroodSmartSet

## 2017-07-29 NOTE — ED Notes (Signed)
First Nurse Note:  Patient states she felt bad this AM and took her BP at home with reading of 226/116.  Brought home BP machine with her.

## 2017-07-29 NOTE — ED Triage Notes (Addendum)
Pt to ed with c/o elevated BP.  Pt states she started wellbutrin about 13 days ago, took med for 8 days and then stopped due to elevated bp.  Pt states she did not call MD about BP or about stopping the med.  Pt states she took extra lisinopril but weakness, headache, chest pain and elevated BP did not resolve.

## 2017-07-29 NOTE — ED Notes (Addendum)
Dr. Imogene Burnhen contacted reference concern about 20 mg IV Labetalol order due to multiple doses of medication for BP already given. Dr. Imogene Burnhen advised to only administer 10 mg IV Labetalol at this time.

## 2017-07-29 NOTE — Telephone Encounter (Signed)
See ED notes.
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
## 2017-07-30 DIAGNOSIS — I161 Hypertensive emergency: Secondary | ICD-10-CM | POA: Diagnosis not present

## 2017-07-30 MED ORDER — ENOXAPARIN SODIUM 30 MG/0.3ML ~~LOC~~ SOLN: 30.0000 mg | SUBCUTANEOUS | Status: DC

## 2017-07-30 MED ORDER — LISINOPRIL 20 MG PO TABS: 20.0000 mg | ORAL_TABLET | Freq: Every day | ORAL | 1 refills | Status: DC

## 2017-07-30 NOTE — Telephone Encounter (Signed)
ED noted reviewed.

## 2017-07-30 NOTE — Progress Notes (Signed)
Patient is discharge home in a stable condition  Via wheel chair with BP within limit , summary and f/u care given , verbalized understanding .

## 2017-07-30 NOTE — Plan of Care (Signed)

## 2017-07-30 NOTE — Care Management (Signed)
Informed members of care team that CM would like to touch bases with patient due to age and admitting sx.  Informed that patient did not wish to wait.  There was no a consult for CM
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
## 2017-07-30 NOTE — Clinical Social Work Note (Signed)
CSW received consult for caregiver stressor, CSW attempted to see patient but she had discharged already.  Ervin KnackEric R. Nuri Branca, MSW, Theresia MajorsLCSWA [phone]  07/30/2017 12:14 PM

## 2017-07-31 ENCOUNTER — Telehealth: Payer: Self-pay | Admitting: *Deleted

## 2017-07-31 NOTE — Telephone Encounter (Signed)
Transition Care Management Follow-up Telephone Call   Date discharged? 07/30/2017   How have you been since you were released from the hospital? "just fine"   Do you understand why you were in the hospital? yes   Do you understand the discharge instructions? yes   Where were you discharged to? home   Items Reviewed:  Medications reviewed: yes  Allergies reviewed: yes  Dietary changes reviewed: yes  Referrals reviewed: yes   Functional Questionnaire:   Activities of Daily Living (ADLs):   She states they are independent in the following: independent in all areas    Any transportation issues/concerns?: no   Any patient concerns? no   Confirmed importance and date/time of follow-up visits scheduled yes  Provider Appointment booked with Dr Ermalene SearingBedsole 8/8  Confirmed with patient if condition begins to worsen call PCP or go to the ER.  Patient was given the office number and encouraged to call back with question or concerns.  : yes

## 2017-08-02 ENCOUNTER — Other Ambulatory Visit: Payer: Self-pay

## 2017-08-02 NOTE — Patient Outreach (Signed)
[facility] Care Management  08/02/2017  Carla Campos 06-26-1928 [id number]     EMMI-General Discharge RED ON EMMI ALERT Day # 1 Date: 08/01/17 Red Alert Reason: " Unfilled prescriptions? Yes"   Outreach attempt # 1 to patient. Spoke with patient who voices "everything is going good" and she is "feeling good."  Reviewed and addressed red alert with patient. She confirmed that she does have all her meds in the home. She reports she answered that way as she had just refilled her Lisinopril 10mg  tablets and upon discharge her dosage was increased. Instead of getting new script filled she has just been taking the two 10mg  tabs to equal the correct dosage of 20mg  until she runs out of this bottle first. She denies any other issue or concerns regarding her meds. PCP office has completed TOC call with patient already. Patient confirmed that she goes for follow up appt on 08/09/47. She voices no issues with transportation. Patient denies any further RN CM needs or concerns at this time. Advised patient that they would get one more automated EMMI-GENERAL post discharge calls to assess how they are doing following recent hospitalization and will receive a call from a nurse if any of their responses were abnormal. Patient voiced understanding and was appreciative of f/u call    Plan: RN CM will close case at this time.    Antionette Fairyoshanda Miasia Crabtree, RN,BSN,CCM [facility] Care Management Telephonic Care Management Coordinator Direct Phone: [phone] Toll Free: [phone] Fax: [phone]

## 2017-08-05 ENCOUNTER — Telehealth: Payer: Self-pay | Admitting: Family Medicine

## 2017-08-05 DIAGNOSIS — E119 Type 2 diabetes mellitus without complications: Secondary | ICD-10-CM

## 2017-08-05 DIAGNOSIS — I10 Essential (primary) hypertension: Secondary | ICD-10-CM

## 2017-08-05 NOTE — Telephone Encounter (Signed)
-----   Message from Alvina Chouerri J Walsh sent at 07/31/2017  3:43 PM EDT ----- Regarding: Lab orders for Tuesday, 8.6.19 Lab orders for Hospital f/u and a 6 month f/u in 2 weeks

## 2017-08-06 ENCOUNTER — Other Ambulatory Visit (INDEPENDENT_AMBULATORY_CARE_PROVIDER_SITE_OTHER): Payer: Medicare Other

## 2017-08-06 DIAGNOSIS — E119 Type 2 diabetes mellitus without complications: Secondary | ICD-10-CM

## 2017-08-06 LAB — COMPREHENSIVE METABOLIC PANEL
ALK PHOS: 67 U/L (ref 39–117)
ALT: 16 U/L (ref 0–35)
AST: 23 U/L (ref 0–37)
Albumin: 4.1 g/dL (ref 3.5–5.2)
BUN: 23 mg/dL (ref 6–23)
CHLORIDE: 104 meq/L (ref 96–112)
CO2: 31 mEq/L (ref 19–32)
Calcium: 9.9 mg/dL (ref 8.4–10.5)
Creatinine, Ser: 1.02 mg/dL (ref 0.40–1.20)
GFR: 54.52 mL/min — AB (ref >60.00)
GLUCOSE: 141 mg/dL — AB (ref 70–99)
POTASSIUM: 5.4 meq/L — AB (ref 3.5–5.1)
Sodium: 142 mEq/L (ref 135–145)
Total Bilirubin: 0.6 mg/dL (ref 0.2–1.2)
Total Protein: 7.2 g/dL (ref 6.0–8.3)

## 2017-08-06 LAB — LIPID PANEL
CHOLESTEROL: 284 mg/dL — AB (ref 0–200)
HDL: 69.3 mg/dL (ref >39.00)
LDL Cholesterol: 176 mg/dL — ABNORMAL HIGH (ref 0–99)
NonHDL: 214.81
Total CHOL/HDL Ratio: 4
Triglycerides: 195 mg/dL — ABNORMAL HIGH (ref 0.0–149.0)
VLDL: 39 mg/dL (ref 0.0–40.0)

## 2017-08-06 LAB — HEMOGLOBIN A1C: HEMOGLOBIN A1C: 6.3 % (ref 4.6–6.5)

## 2017-08-08 ENCOUNTER — Encounter: Payer: Self-pay | Admitting: Family Medicine

## 2017-08-08 ENCOUNTER — Ambulatory Visit (INDEPENDENT_AMBULATORY_CARE_PROVIDER_SITE_OTHER): Payer: Medicare Other | Admitting: Family Medicine

## 2017-08-08 VITALS — BP 142/80 | HR 74 | Temp 98.4°F | Ht 61.0 in | Wt 110.2 lb

## 2017-08-08 DIAGNOSIS — E7800 Pure hypercholesterolemia, unspecified: Secondary | ICD-10-CM

## 2017-08-08 DIAGNOSIS — I10 Essential (primary) hypertension: Secondary | ICD-10-CM

## 2017-08-08 DIAGNOSIS — F331 Major depressive disorder, recurrent, moderate: Secondary | ICD-10-CM | POA: Diagnosis not present

## 2017-08-08 DIAGNOSIS — I6523 Occlusion and stenosis of bilateral carotid arteries: Secondary | ICD-10-CM

## 2017-08-08 DIAGNOSIS — E119 Type 2 diabetes mellitus without complications: Secondary | ICD-10-CM | POA: Diagnosis not present

## 2017-08-08 DIAGNOSIS — I63239 Cerebral infarction due to unspecified occlusion or stenosis of unspecified carotid arteries: Secondary | ICD-10-CM

## 2017-08-08 LAB — HM DIABETES FOOT EXAM

## 2017-08-08 NOTE — Assessment & Plan Note (Signed)
Very poor control on no med. Pt refuses statin despite being far from goal with carotid stenosis < 70.

## 2017-08-08 NOTE — Assessment & Plan Note (Signed)
Improved with planned move to be with family.

## 2017-08-08 NOTE — Assessment & Plan Note (Signed)
At goal on no med. 

## 2017-08-08 NOTE — Patient Instructions (Addendum)
Limit high potassium containing foods, increase water intake.  Return in 1 week for recheck potassium level... Lab only appt  Cancel next appt on 8/16 for DM follow up.

## 2017-08-08 NOTE — Assessment & Plan Note (Signed)
Now improved control on higher dose lisinopril and with happiness on move.

## 2017-08-08 NOTE — Assessment & Plan Note (Signed)
Much improved with improvement in stres and higher dose of lisinopril. Repeat Cr stable/improved on higher dose ACEI

## 2017-08-08 NOTE — Progress Notes (Signed)
Subjective:    Patient ID: Carla Campos, female    DOB: 04/24/1928, 84 y.o.   MRN: [id number]  HPI   84 year old female presents for follow up hospitalization for malignant hypertension.  TCM call on 7/31  Admission 7/29 for hypertensive emergency: given some IV metoprolol, IV hydralazine while in the hospital and resume back on her home meds. It was felt home stress was cause of her increased BP. She was also started on Wellbutrin on 07/11/2017 but she had stopped before she had BP issues.   She is very happy now she is moving to [place] to live son and daughter in [place]. This argument with husband about moving or not was directly related  To her Bp spike.   Her lisinopril dose was increased to  20 mg daily.   Since discharge she reports she is feeling much better.  She denies dizziness, no headache, no chest pain.  No SE on higher dose of lisinopril.  Recent potassium high.. No potassium supplement,  Does eat bananas.   DM well controlled  with diet. Eye exam uptodate 11/2016  No ulcers. Lab Results  Component Value Date   HGBA1C 6.3 08/06/2017     High cholesterol.. Far from goal but better than last year.  She continues to refuse medication for Goal given carotid stenosis < 70.    Social History /Family History/Past Medical History reviewed in detail and updated in EMR if needed. Blood pressure (!) 142/80, pulse 74, temperature 98.4 F (36.9 C), temperature source Oral, height 5\' 1"  (1.549 m), weight 110 lb 4 oz (50 kg).  Review of Systems  Constitutional: Negative for fatigue and fever.  HENT: Negative for ear pain.   Eyes: Negative for pain.  Respiratory: Negative for chest tightness and shortness of breath.   Cardiovascular: Negative for chest pain, palpitations and leg swelling.  Gastrointestinal: Negative for abdominal pain.  Genitourinary: Negative for dysuria.       Objective:   Physical Exam  Constitutional: Vital signs are normal. She appears  well-developed and well-nourished. She is cooperative.  Non-toxic appearance. She does not appear ill. No distress.  HENT:  Head: Normocephalic.  Right Ear: Hearing, tympanic membrane, external ear and ear canal normal. Tympanic membrane is not erythematous, not retracted and not bulging.  Left Ear: Hearing, tympanic membrane, external ear and ear canal normal. Tympanic membrane is not erythematous, not retracted and not bulging.  Nose: No mucosal edema or rhinorrhea. Right sinus exhibits no maxillary sinus tenderness and no frontal sinus tenderness. Left sinus exhibits no maxillary sinus tenderness and no frontal sinus tenderness.  Mouth/Throat: Uvula is midline, oropharynx is clear and moist and mucous membranes are normal.  Eyes: Pupils are equal, round, and reactive to light. Conjunctivae, EOM and lids are normal. Lids are everted and swept, no foreign bodies found.  Neck: Trachea normal and normal range of motion. Neck supple. Carotid bruit is not present. No thyroid mass and no thyromegaly present.  Cardiovascular: Normal rate, regular rhythm, S1 normal, S2 normal, normal heart sounds, intact distal pulses and normal pulses. Exam reveals no gallop and no friction rub.  No murmur heard. Pulmonary/Chest: Effort normal and breath sounds normal. No tachypnea. No respiratory distress. She has no decreased breath sounds. She has no wheezes. She has no rhonchi. She has no rales.  Abdominal: Soft. Normal appearance and bowel sounds are normal. There is no tenderness.  Neurological: She is alert.  Skin: Skin is warm, dry and intact. No rash  noted.  Psychiatric: Her speech is normal and behavior is normal. Judgment and thought content normal. Her mood appears not anxious. Cognition and memory are normal. She does not exhibit a depressed mood.      Diabetic foot exam: Normal inspection No skin breakdown No calluses  Normal DP pulses Normal sensation to light touch and monofilament Nails  normal        Assessment & Plan:

## 2017-08-09 ENCOUNTER — Other Ambulatory Visit: Payer: Medicare Other

## 2017-08-15 ENCOUNTER — Ambulatory Visit (INDEPENDENT_AMBULATORY_CARE_PROVIDER_SITE_OTHER): Payer: Medicare Other | Admitting: Nurse Practitioner

## 2017-08-15 ENCOUNTER — Ambulatory Visit (INDEPENDENT_AMBULATORY_CARE_PROVIDER_SITE_OTHER): Payer: Medicare Other

## 2017-08-15 ENCOUNTER — Encounter (INDEPENDENT_AMBULATORY_CARE_PROVIDER_SITE_OTHER): Payer: Self-pay | Admitting: Nurse Practitioner

## 2017-08-15 VITALS — BP 160/84 | HR 84 | Resp 13 | Ht 62.0 in | Wt 110.0 lb

## 2017-08-15 DIAGNOSIS — I10 Essential (primary) hypertension: Secondary | ICD-10-CM | POA: Diagnosis not present

## 2017-08-15 DIAGNOSIS — I6523 Occlusion and stenosis of bilateral carotid arteries: Secondary | ICD-10-CM

## 2017-08-15 DIAGNOSIS — I63239 Cerebral infarction due to unspecified occlusion or stenosis of unspecified carotid arteries: Secondary | ICD-10-CM

## 2017-08-15 DIAGNOSIS — E119 Type 2 diabetes mellitus without complications: Secondary | ICD-10-CM | POA: Diagnosis not present
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
## 2017-08-16 ENCOUNTER — Telehealth: Payer: Self-pay | Admitting: Family Medicine

## 2017-08-16 ENCOUNTER — Ambulatory Visit: Payer: Medicare Other | Admitting: Family Medicine

## 2017-08-16 ENCOUNTER — Other Ambulatory Visit: Payer: Medicare Other

## 2017-08-16 DIAGNOSIS — E876 Hypokalemia: Secondary | ICD-10-CM

## 2017-08-16 NOTE — Telephone Encounter (Signed)
-----   Message from Alvina Chouerri J Walsh sent at 08/12/2017  2:21 PM EDT ----- Regarding: Lab orders for Friday, 8.16.19 Lab orders, no f/u appt

## 2017-08-19 ENCOUNTER — Other Ambulatory Visit (INDEPENDENT_AMBULATORY_CARE_PROVIDER_SITE_OTHER): Payer: Medicare Other

## 2017-08-19 DIAGNOSIS — E876 Hypokalemia: Secondary | ICD-10-CM

## 2017-08-19 LAB — POTASSIUM: Potassium: 4.7 mEq/L (ref 3.5–5.1)

## 2017-08-26 ENCOUNTER — Other Ambulatory Visit: Payer: Self-pay | Admitting: Family Medicine
